# Patient Record
Sex: Female | Born: 1990 | Race: Black or African American | Hispanic: No | Marital: Single | Smoking: Former smoker
Health system: Southern US, Community
[De-identification: ages and names within clinical notes are randomized; demographics above are authoritative.]

## PROBLEM LIST (undated history)

## (undated) ENCOUNTER — Emergency Department: Payer: 59 | Source: Home / Self Care

## (undated) DIAGNOSIS — S92911A Unspecified fracture of right toe(s), initial encounter for closed fracture: Secondary | ICD-10-CM

## (undated) DIAGNOSIS — E282 Polycystic ovarian syndrome: Secondary | ICD-10-CM

## (undated) DIAGNOSIS — N390 Urinary tract infection, site not specified: Secondary | ICD-10-CM

## (undated) DIAGNOSIS — K2970 Gastritis, unspecified, without bleeding: Secondary | ICD-10-CM

## (undated) DIAGNOSIS — N83209 Unspecified ovarian cyst, unspecified side: Secondary | ICD-10-CM

## (undated) HISTORY — DX: Polycystic ovarian syndrome: E28.2

## (undated) HISTORY — PX: WISDOM TOOTH EXTRACTION: SHX21

## (undated) HISTORY — PX: COSMETIC SURGERY: SHX468

## (undated) HISTORY — PX: OTHER SURGICAL HISTORY: SHX169

---
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
## 2013-09-07 ENCOUNTER — Emergency Department: Payer: Self-pay | Admitting: Emergency Medicine

## 2013-09-07 ENCOUNTER — Emergency Department: Payer: Self-pay | Admitting: Internal Medicine

## 2013-09-07 LAB — COMPREHENSIVE METABOLIC PANEL
ALBUMIN: 4.2 g/dL (ref 3.4–5.0)
ALK PHOS: 64 U/L
ALT: 16 U/L (ref 12–78)
Anion Gap: 3 — ABNORMAL LOW (ref 7–16)
BUN: 7 mg/dL (ref 7–18)
Bilirubin,Total: 0.3 mg/dL (ref 0.2–1.0)
Calcium, Total: 8.9 mg/dL (ref 8.5–10.1)
Chloride: 110 mmol/L — ABNORMAL HIGH (ref 98–107)
Co2: 26 mmol/L (ref 21–32)
Creatinine: 0.79 mg/dL (ref 0.60–1.30)
EGFR (African American): >60
EGFR (Non-African Amer.): >60
Glucose: 96 mg/dL (ref 65–99)
Osmolality: 275 (ref 275–301)
Potassium: 3.4 mmol/L — ABNORMAL LOW (ref 3.5–5.1)
SGOT(AST): 20 U/L (ref 15–37)
SODIUM: 139 mmol/L (ref 136–145)
Total Protein: 7.8 g/dL (ref 6.4–8.2)

## 2013-09-07 LAB — CBC WITH DIFFERENTIAL/PLATELET
Basophil #: 0.1 10*3/uL (ref 0.0–0.1)
Basophil %: 0.6 %
Eosinophil #: 0.1 10*3/uL (ref 0.0–0.7)
Eosinophil %: 1.4 %
HCT: 38.8 % (ref 35.0–47.0)
HGB: 13 g/dL (ref 12.0–16.0)
LYMPHS ABS: 2.2 10*3/uL (ref 1.0–3.6)
Lymphocyte %: 22.9 %
MCH: 29.1 pg (ref 26.0–34.0)
MCHC: 33.5 g/dL (ref 32.0–36.0)
MCV: 87 fL (ref 80–100)
MONO ABS: 0.7 x10 3/mm (ref 0.2–0.9)
Monocyte %: 7.5 %
NEUTROS PCT: 67.6 %
Neutrophil #: 6.3 10*3/uL (ref 1.4–6.5)
PLATELETS: 280 10*3/uL (ref 150–440)
RBC: 4.47 10*6/uL (ref 3.80–5.20)
RDW: 13.7 % (ref 11.5–14.5)
WBC: 9.4 10*3/uL (ref 3.6–11.0)

## 2013-09-07 LAB — URINALYSIS, COMPLETE
Bilirubin,UR: NEGATIVE
Glucose,UR: NEGATIVE mg/dL (ref 0–75)
Ketone: NEGATIVE
Leukocyte Esterase: NEGATIVE
NITRITE: NEGATIVE
PH: 5 (ref 4.5–8.0)
Protein: 30
RBC,UR: 2 /HPF (ref 0–5)
Specific Gravity: 1.027 (ref 1.003–1.030)
Squamous Epithelial: 5

## 2013-09-07 LAB — PREGNANCY, URINE: PREGNANCY TEST, URINE: NEGATIVE m[IU]/mL

## 2013-09-09 ENCOUNTER — Emergency Department: Payer: Self-pay | Admitting: Emergency Medicine

## 2013-09-09 LAB — COMPREHENSIVE METABOLIC PANEL
ALBUMIN: 3.9 g/dL (ref 3.4–5.0)
ALT: 13 U/L (ref 12–78)
ANION GAP: 6 — AB (ref 7–16)
Alkaline Phosphatase: 58 U/L
BUN: 6 mg/dL — ABNORMAL LOW (ref 7–18)
Bilirubin,Total: 0.4 mg/dL (ref 0.2–1.0)
CO2: 25 mmol/L (ref 21–32)
CREATININE: 0.82 mg/dL (ref 0.60–1.30)
Calcium, Total: 8.4 mg/dL — ABNORMAL LOW (ref 8.5–10.1)
Chloride: 108 mmol/L — ABNORMAL HIGH (ref 98–107)
EGFR (African American): >60
GLUCOSE: 85 mg/dL (ref 65–99)
Osmolality: 274 (ref 275–301)
Potassium: 3.5 mmol/L (ref 3.5–5.1)
SGOT(AST): 18 U/L (ref 15–37)
Sodium: 139 mmol/L (ref 136–145)
Total Protein: 7.2 g/dL (ref 6.4–8.2)

## 2013-09-09 LAB — URINALYSIS, COMPLETE
BACTERIA: NONE SEEN
Bilirubin,UR: NEGATIVE
GLUCOSE, UR: NEGATIVE mg/dL (ref 0–75)
LEUKOCYTE ESTERASE: NEGATIVE
Nitrite: NEGATIVE
Ph: 6 (ref 4.5–8.0)
Protein: NEGATIVE
Specific Gravity: 1.011 (ref 1.003–1.030)

## 2013-09-09 LAB — CBC WITH DIFFERENTIAL/PLATELET
BASOS ABS: 0.1 10*3/uL (ref 0.0–0.1)
BASOS PCT: 0.8 %
EOS ABS: 0.1 10*3/uL (ref 0.0–0.7)
EOS PCT: 1.8 %
HCT: 38 % (ref 35.0–47.0)
HGB: 12.3 g/dL (ref 12.0–16.0)
LYMPHS ABS: 2.5 10*3/uL (ref 1.0–3.6)
Lymphocyte %: 30.2 %
MCH: 28.4 pg (ref 26.0–34.0)
MCHC: 32.3 g/dL (ref 32.0–36.0)
MCV: 88 fL (ref 80–100)
MONO ABS: 0.8 x10 3/mm (ref 0.2–0.9)
MONOS PCT: 9.7 %
NEUTROS ABS: 4.7 10*3/uL (ref 1.4–6.5)
NEUTROS PCT: 57.5 %
PLATELETS: 278 10*3/uL (ref 150–440)
RBC: 4.33 10*6/uL (ref 3.80–5.20)
RDW: 13.2 % (ref 11.5–14.5)
WBC: 8.2 10*3/uL (ref 3.6–11.0)

## 2013-09-12 ENCOUNTER — Emergency Department: Payer: Self-pay | Admitting: Emergency Medicine

## 2013-09-12 LAB — URINALYSIS, COMPLETE
BACTERIA: NONE SEEN
Bilirubin,UR: NEGATIVE
Blood: NEGATIVE
Glucose,UR: NEGATIVE mg/dL (ref 0–75)
LEUKOCYTE ESTERASE: NEGATIVE
Nitrite: NEGATIVE
Ph: 6 (ref 4.5–8.0)
Protein: NEGATIVE
SPECIFIC GRAVITY: 1.015 (ref 1.003–1.030)
Squamous Epithelial: 1

## 2013-09-12 LAB — CBC WITH DIFFERENTIAL/PLATELET
Basophil #: 0.1 10*3/uL (ref 0.0–0.1)
Basophil %: 1 %
EOS PCT: 1.6 %
Eosinophil #: 0.2 10*3/uL (ref 0.0–0.7)
HCT: 39 % (ref 35.0–47.0)
HGB: 13.6 g/dL (ref 12.0–16.0)
LYMPHS PCT: 29 %
Lymphocyte #: 2.9 10*3/uL (ref 1.0–3.6)
MCH: 29.8 pg (ref 26.0–34.0)
MCHC: 34.8 g/dL (ref 32.0–36.0)
MCV: 86 fL (ref 80–100)
Monocyte #: 0.9 x10 3/mm (ref 0.2–0.9)
Monocyte %: 9.6 %
NEUTROS ABS: 5.8 10*3/uL (ref 1.4–6.5)
Neutrophil %: 58.8 %
Platelet: 299 10*3/uL (ref 150–440)
RBC: 4.55 10*6/uL (ref 3.80–5.20)
RDW: 13.4 % (ref 11.5–14.5)
WBC: 9.8 10*3/uL (ref 3.6–11.0)

## 2013-09-12 LAB — COMPREHENSIVE METABOLIC PANEL
ALK PHOS: 60 U/L
AST: 5 U/L — AB (ref 15–37)
Albumin: 4.3 g/dL (ref 3.4–5.0)
Anion Gap: 8 (ref 7–16)
BUN: 6 mg/dL — ABNORMAL LOW (ref 7–18)
Bilirubin,Total: 0.4 mg/dL (ref 0.2–1.0)
CHLORIDE: 108 mmol/L — AB (ref 98–107)
CO2: 24 mmol/L (ref 21–32)
CREATININE: 0.8 mg/dL (ref 0.60–1.30)
Calcium, Total: 9.3 mg/dL (ref 8.5–10.1)
EGFR (Non-African Amer.): >60
Glucose: 89 mg/dL (ref 65–99)
OSMOLALITY: 276 (ref 275–301)
POTASSIUM: 3.5 mmol/L (ref 3.5–5.1)
SGPT (ALT): 9 U/L — ABNORMAL LOW (ref 12–78)
Sodium: 140 mmol/L (ref 136–145)
Total Protein: 8 g/dL (ref 6.4–8.2)

## 2013-09-12 LAB — LIPASE, BLOOD: LIPASE: 105 U/L (ref 73–393)

## 2013-09-16 ENCOUNTER — Emergency Department: Payer: Self-pay | Admitting: Emergency Medicine

## 2013-09-16 LAB — URINALYSIS, COMPLETE
BACTERIA: NONE SEEN
Bilirubin,UR: NEGATIVE
Glucose,UR: NEGATIVE mg/dL (ref 0–75)
Leukocyte Esterase: NEGATIVE
Nitrite: NEGATIVE
PH: 5 (ref 4.5–8.0)
PROTEIN: NEGATIVE
RBC,UR: 1 /HPF (ref 0–5)
Specific Gravity: 1.024 (ref 1.003–1.030)
Squamous Epithelial: <1
WBC UR: 2 /HPF (ref 0–5)

## 2013-09-16 LAB — CBC WITH DIFFERENTIAL/PLATELET
Basophil #: 0 10*3/uL (ref 0.0–0.1)
Basophil %: 0.3 %
EOS ABS: 0.1 10*3/uL (ref 0.0–0.7)
Eosinophil %: 0.7 %
HCT: 39.5 % (ref 35.0–47.0)
HGB: 12.9 g/dL (ref 12.0–16.0)
Lymphocyte #: 1.9 10*3/uL (ref 1.0–3.6)
Lymphocyte %: 16.6 %
MCH: 27.9 pg (ref 26.0–34.0)
MCHC: 32.7 g/dL (ref 32.0–36.0)
MCV: 86 fL (ref 80–100)
Monocyte #: 0.9 x10 3/mm (ref 0.2–0.9)
Monocyte %: 7.8 %
NEUTROS ABS: 8.7 10*3/uL — AB (ref 1.4–6.5)
Neutrophil %: 74.6 %
PLATELETS: 294 10*3/uL (ref 150–440)
RBC: 4.61 10*6/uL (ref 3.80–5.20)
RDW: 13.3 % (ref 11.5–14.5)
WBC: 11.6 10*3/uL — ABNORMAL HIGH (ref 3.6–11.0)

## 2013-09-16 LAB — COMPREHENSIVE METABOLIC PANEL
ALK PHOS: 63 U/L
ALT: 14 U/L (ref 12–78)
Albumin: 4.5 g/dL (ref 3.4–5.0)
Anion Gap: 9 (ref 7–16)
BILIRUBIN TOTAL: 0.4 mg/dL (ref 0.2–1.0)
BUN: 8 mg/dL (ref 7–18)
Calcium, Total: 8.8 mg/dL (ref 8.5–10.1)
Chloride: 106 mmol/L (ref 98–107)
Co2: 22 mmol/L (ref 21–32)
Creatinine: 0.87 mg/dL (ref 0.60–1.30)
EGFR (African American): >60
GLUCOSE: 80 mg/dL (ref 65–99)
OSMOLALITY: 271 (ref 275–301)
Potassium: 3.6 mmol/L (ref 3.5–5.1)
SGOT(AST): 18 U/L (ref 15–37)
SODIUM: 137 mmol/L (ref 136–145)
Total Protein: 7.9 g/dL (ref 6.4–8.2)

## 2013-09-16 LAB — LIPASE, BLOOD: Lipase: 79 U/L (ref 73–393)

## 2013-09-17 ENCOUNTER — Observation Stay: Payer: Self-pay | Admitting: Internal Medicine

## 2013-09-17 LAB — CLOSTRIDIUM DIFFICILE(ARMC)

## 2013-09-17 LAB — GC/CHLAMYDIA PROBE AMP

## 2013-09-18 LAB — BASIC METABOLIC PANEL
Anion Gap: 8 (ref 7–16)
BUN: 4 mg/dL — AB (ref 7–18)
Calcium, Total: 8.5 mg/dL (ref 8.5–10.1)
Chloride: 108 mmol/L — ABNORMAL HIGH (ref 98–107)
Co2: 21 mmol/L (ref 21–32)
Creatinine: 0.82 mg/dL (ref 0.60–1.30)
EGFR (African American): >60
Glucose: 65 mg/dL (ref 65–99)
Osmolality: 269 (ref 275–301)
POTASSIUM: 3.6 mmol/L (ref 3.5–5.1)
Sodium: 137 mmol/L (ref 136–145)

## 2013-09-18 LAB — CBC WITH DIFFERENTIAL/PLATELET
Basophil #: 0 10*3/uL (ref 0.0–0.1)
Basophil %: 0.7 %
EOS ABS: 0.2 10*3/uL (ref 0.0–0.7)
EOS PCT: 2.5 %
HCT: 35 % (ref 35.0–47.0)
HGB: 12.2 g/dL (ref 12.0–16.0)
LYMPHS ABS: 2.3 10*3/uL (ref 1.0–3.6)
LYMPHS PCT: 33.5 %
MCH: 30.2 pg (ref 26.0–34.0)
MCHC: 34.7 g/dL (ref 32.0–36.0)
MCV: 87 fL (ref 80–100)
Monocyte #: 0.7 x10 3/mm (ref 0.2–0.9)
Monocyte %: 9.8 %
NEUTROS PCT: 53.5 %
Neutrophil #: 3.7 10*3/uL (ref 1.4–6.5)
Platelet: 232 10*3/uL (ref 150–440)
RBC: 4.03 10*6/uL (ref 3.80–5.20)
RDW: 13.7 % (ref 11.5–14.5)
WBC: 6.8 10*3/uL (ref 3.6–11.0)

## 2013-09-19 LAB — STOOL CULTURE

## 2013-09-19 LAB — WBCS, STOOL

## 2013-09-21 LAB — STOOL CULTURE

## 2013-09-23 LAB — PATHOLOGY REPORT

## 2014-06-15 ENCOUNTER — Emergency Department: Payer: Self-pay | Admitting: Emergency Medicine

## 2014-06-15 LAB — URINALYSIS, COMPLETE
BACTERIA: NONE SEEN
Bilirubin,UR: NEGATIVE
Blood: NEGATIVE
Glucose,UR: NEGATIVE mg/dL (ref 0–75)
Ketone: NEGATIVE
NITRITE: NEGATIVE
PROTEIN: NEGATIVE
Ph: 8 (ref 4.5–8.0)
SPECIFIC GRAVITY: 1.018 (ref 1.003–1.030)
Squamous Epithelial: 7

## 2014-06-15 LAB — COMPREHENSIVE METABOLIC PANEL
ALBUMIN: 4.1 g/dL (ref 3.4–5.0)
ALK PHOS: 48 U/L
Anion Gap: 7 (ref 7–16)
BILIRUBIN TOTAL: 0.3 mg/dL (ref 0.2–1.0)
BUN: 10 mg/dL (ref 7–18)
CHLORIDE: 108 mmol/L — AB (ref 98–107)
Calcium, Total: 8.6 mg/dL (ref 8.5–10.1)
Co2: 25 mmol/L (ref 21–32)
Creatinine: 0.92 mg/dL (ref 0.60–1.30)
EGFR (African American): >60
EGFR (Non-African Amer.): >60
Glucose: 103 mg/dL — ABNORMAL HIGH (ref 65–99)
OSMOLALITY: 279 (ref 275–301)
POTASSIUM: 3.6 mmol/L (ref 3.5–5.1)
SGOT(AST): 17 U/L (ref 15–37)
SGPT (ALT): 16 U/L
Sodium: 140 mmol/L (ref 136–145)
Total Protein: 7.5 g/dL (ref 6.4–8.2)

## 2014-06-15 LAB — CBC WITH DIFFERENTIAL/PLATELET
BASOS ABS: 0.3 10*3/uL — AB (ref 0.0–0.1)
Basophil %: 3.4 %
EOS PCT: 0.6 %
Eosinophil #: 0.1 10*3/uL (ref 0.0–0.7)
HCT: 39 % (ref 35.0–47.0)
HGB: 13 g/dL (ref 12.0–16.0)
LYMPHS ABS: 0.9 10*3/uL — AB (ref 1.0–3.6)
Lymphocyte %: 10.9 %
MCH: 29.9 pg (ref 26.0–34.0)
MCHC: 33.3 g/dL (ref 32.0–36.0)
MCV: 90 fL (ref 80–100)
Monocyte #: 0.3 x10 3/mm (ref 0.2–0.9)
Monocyte %: 3.5 %
NEUTROS PCT: 81.6 %
Neutrophil #: 6.7 10*3/uL — ABNORMAL HIGH (ref 1.4–6.5)
Platelet: 248 10*3/uL (ref 150–440)
RBC: 4.35 10*6/uL (ref 3.80–5.20)
RDW: 13.7 % (ref 11.5–14.5)
WBC: 8.2 10*3/uL (ref 3.6–11.0)

## 2014-06-15 LAB — LIPASE, BLOOD: Lipase: 78 U/L (ref 73–393)

## 2014-07-03 ENCOUNTER — Emergency Department: Payer: Self-pay | Admitting: Emergency Medicine

## 2014-10-24 NOTE — H&P (Signed)
PATIENT NAME:  Rose Campos, Rose Campos MR#:  [id number] DATE OF BIRTH:  Oct 25, 1990  DATE OF ADMISSION:  09/16/2013  REFERRING PHYSICIAN: Valli Glance. Owens Shark, MD  PRIMARY CARE PHYSICIAN: The patient started to follow at [facility] recently.   CHIEF COMPLAINT: Nausea, vomiting, diarrhea.   HISTORY OF PRESENT ILLNESS: This is a 24 year old female without significant past medical history, who presents with complaints of nausea, vomiting and diarrhea. The patient reports her symptoms have been going on for the last 10 days. Reports it did get better after 4 days, but then it started to worsen over the last 2 days. Reports she had vomited x2 yesterday, with 3 episodes of watery diarrhea. Denies any fever, any chills, any abdominal pain, any lightheadedness, any dizziness. The patient was in ED yesterday for similar complaints, where she was requested to follow with Dr. Vira Agar. She went to see Dr. Vira Agar at the office and reports she had some blood work done. She reports she had some stool workup done for blood which she reported was negative. Reports she was requested by Dr. Vira Agar to come to the ED. ED discussed with Dr. Vira Agar, who requested the patient to be admitted for further workup. The patient had blood work done yesterday which did not show any renal failure. Since the patient has been in ED, she did not have any nausea, any diarrhea, so we could not collect any stool workup from her.   PAST MEDICAL HISTORY: None.   PAST SURGICAL HISTORY: None.   ALLERGIES: NICKEL.   HOME MEDICATIONS:  1. Citalopram 20 mg oral daily.  2. Zofran as needed.   SOCIAL HISTORY: The patient is a Electronics engineer. Denies any smoking, any alcohol, any illicit drugs.   FAMILY HISTORY: Denies any family history of inflammatory bowel disease. No Crohn's disease. No ulcerative colitis.   REVIEW OF SYSTEMS:  CONSTITUTIONAL: Denies fever, chills, fatigue, weakness.  EYES: Denies blurry vision, double  vision, inflammation, glaucoma. ENT: Denies tinnitus, ear pain, hearing loss, epistaxis or discharge.  RESPIRATORY: Denies cough, wheezing, hemoptysis, palpitations.  CARDIOVASCULAR: Denies chest pain, edema, arrhythmia.  GASTROINTESTINAL: Reports nausea, vomiting and diarrhea. Denies any abdominal pain, any constipation, any hematemesis, any melena, any rectal bleed, any jaundice.  GENITOURINARY: Denies dysuria, hematuria or renal colic.  ENDOCRINE: Denies polyuria, polydipsia, heat or cold intolerance. HEMATOLOGY: Denies anemia, easy bruising, bleeding diathesis.  INTEGUMENTARY: Denies any acne, rash or skin lesion.  MUSCULOSKELETAL: Denies any gout, swelling, cramps, arthritis. NEUROLOGIC: Denies any migraine, tremors, vertigo, ataxia, headache.  PSYCHIATRIC: Denies any anxiety, depression or insomnia.    PHYSICAL EXAMINATION:  VITAL SIGNS: Temperature 98.1, pulse 89, respiratory rate 16, blood pressure 136/86, saturating 98% on room air.  GENERAL: Physical exam was done in the presence of the patient's female chaperone, her registered nurse, Estill Bamberg. A well-nourished female who looks comfortable, in no apparent distress.  HEENT: Head atraumatic, normocephalic. Pupils equally reactive to light. Pink conjunctivae. Anicteric sclerae. Moist oral mucosa.  NECK: Supple. No thyromegaly. No JVD.  CHEST: Good air entry bilaterally. No wheezing, rales, rhonchi.  CARDIOVASCULAR: S1, S2 heard. No rubs, murmurs or gallops.  ABDOMEN: Mild tenderness in the left upper quadrant. Bowel sounds present. No rebound, no guarding. No hepatosplenomegaly.  EXTREMITIES: No edema. No clubbing. No cyanosis. Pedal pulses +2 bilaterally.  PSYCHIATRIC: Appropriate affect. Awake, alert x3. Intact judgment and insight.  NEUROLOGIC: Cranial nerves grossly intact. Motor 5 out of 5. No focal deficits.  SKIN: Normal skin turgor. Warm and dry.  MUSCULOSKELETAL: No  joint effusion or erythema.   PERTINENT LABORATORIES: As of  March 17th, glucose 80, BUN 8, creatinine 0.87, sodium 137, potassium 3.6, chloride 106, CO2 22, ALT 14, AST 18, alkaline phosphatase 63, total bilirubin 0.4. White blood cells 11.6, hemoglobin 12.9, hematocrit 39.5, platelets 294. Urinalysis negative for leukocyte esterase and nitrite. Point-of-care urine pregnancy test negative.   ASSESSMENT AND PLAN:  1. Nausea, vomiting, diarrhea. This is most likely related to viral gastroenteritis. The patient will be admitted for further workup. She will be kept n.p.o., if she is decided to have any further workup by Dr. Vira Agar in the morning. Will obtain CT abdomen and pelvis with IV and p.o. contrast. Will check stool workup, including ova and parasites, stool cultures, white blood cells and C. difficile. Continue on hydration, p.r.n. nausea medicine as well.  2. Deep vein thrombosis prophylaxis. Subcutaneous heparin.   CODE STATUS: Full code.   TOTAL TIME SPENT ON ADMISSION AND PATIENT CARE: 40 minutes.   ____________________________ Albertine Patricia, MD dse:lb D: 09/17/2013 05:02:39 ET T: 09/17/2013 05:53:37 ET JOB#: [id number]  cc: Albertine Patricia, MD, <Dictator> Carlen Rebuck Graciela Husbands MD ELECTRONICALLY SIGNED 09/22/2013 1:51

## 2014-10-24 NOTE — Discharge Summary (Signed)
PATIENT NAME:  Rose Campos, SCULLIN MR#:  [id number] DATE OF BIRTH:  1990/10/09  DATE OF ADMISSION:  09/17/2013  DATE OF DISCHARGE:  09/19/2013  DISCHARGE DIAGNOSES: 1.  Gastritis.  2.  Dehydration.  3.  Possible celiac disease.   CONSULTS: Dr. Vira Agar with GI, and Dr. Marcelline Mates with Gynecology.   IMAGING STUDIES INCLUDE:  A CT scan of the abdomen and pelvis, which showed some pelvic fluid with ovarian cyst.   Pelvic ultrasound showed a luteal cyst with pelvic ascites, likely physiologic.   Ultrasound abdomen, right upper quadrant was within normal limits.   ADMITTING HISTORY AND PHYSICAL: Please see detailed H and P dictated previously by Dr. Waldron Labs. In brief, a 24 year old African-American female patient, presented to the hospital from Dr. Percell Boston office for admission after she had some nausea, vomiting, abdominal pain for the past few days, with recurrent visits to the ER.   The patient was admitted, found to be dehydrated, started on IV fluids. Was seen by Dr. Vira Agar, had an EGD done, which showed gastritis and possible celiac disease. She was also seen by Gynecology, and there was a little fluid in the pelvis on CT scan of the abdomen and pelvis. A transvaginal ultrasound was done, which showed physiological findings with a luteal cyst, possible simple ovarian cyst.   Prior to discharge, the patient has tolerated food. Had some mild nausea, but no vomiting. Has ambulated well. Abdomen is nontender. Lungs sound clear, and is being discharged home.   DISCHARGE MEDICATIONS: 1.  Citalopram 20 mg oral once a day.  2.  Zofran ODT 4 mg oral every 4 hours as needed for nausea, vomiting.  3.  Sucralfate 1 gram oral 4 times a day before meals and at bedtime.  4.  Protonix 40 mg oral 2 times a day.   DISCHARGE INSTRUCTIONS: Regular diet. Activity as tolerated. Follow up with primary care physician in 1 to 2 weeks, and with Dr. Vira Agar in 1 to 2 weeks.  Time spent on day of discharge in  discharge activity was 35 minutes.    ____________________________ Rose Alf Jakyra Kenealy, MD srs:mr D: 09/19/2013 15:49:00 ET T: 09/19/2013 22:42:50 ET JOB#: [id number]  cc: Alveta Heimlich R. Markus Casten, MD, <Dictator> Neita Carp MD ELECTRONICALLY SIGNED 09/27/2013 10:27

## 2014-10-24 NOTE — Consult Note (Signed)
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 Her mother is a CNA and her aunt is a Marine scientist. She has never been in a hospital before.   She has had multiple visits to the ER and to different providers in the last two weeks. She says she has been sick since March 6. She has stomach pain around her navel. She has nausea, vomiting, and diarrhea, vomiting 2 or 3 times a day, diarrhea 2 or 3 times a day. She does say the vomiting helps the nausea. She is not running any fever.   Monday, two days ago, she awoke with generalized abdominal pain and pain increases when she moves around. She was seen in the ER, referred to our office. We evaluated her and started a treatment plan, and she went back to the ER within 24 hours, and the decision was made because of her multiple visits that it would be best to admit her to the hospital for evaluation and treatment, especially with IV fluids, and the urine showed 2+ ketones. This was significant evidence that she has not been eating well.   HABITS: The patient denies alcohol. She smokes maybe one cigarette a day.    She has seen her primary doctor and was diagnosed with anxiety and was started on citalopram but had taken maybe one pill only. She had taken Imodium, took two yesterday and has not had a bowel movement since then. Before that, she was having at least two loose stools a day, mostly in the morning. Her last spell of vomiting was at 8:00 yesterday.   PAST MEDICAL AND SURGICAL HISTORY: Negative. She has never been an inpatient before.   ALLERGIES: NICKEL.  HOME MEDICATIONS: Citalopram 20 mg daily.   FAMILY HISTORY: No Crohn disease. No ulcerative colitis.   REVIEW OF SYSTEMS:   CONSTITUTIONAL: No fever or chills. No coughing, wheezing. No chest pains or skipping irregular heartbeats.  GENITOURINARY: No dysuria. No hematuria. No kidney stones. Denies any syncopal spells.   PHYSICAL EXAMINATION:  GENERAL: Black female in no acute distress  VITAL SIGNS: Temperature is 98.9. Pulse 73, respirations 18, blood pressure 119/77, pulse ox 100%.  HEENT: Sclerae anicteric. Conjunctivae negative. Tongue negative.  HEAD: Atraumatic.  CHEST: Clear.  HEART: No murmurs or gallops I can hear.  ABDOMEN: Minimal epigastric tenderness. Minimal tenderness of periumbilical area. No hepatosplenomegaly. No masses. No bruits. No peritoneal signs.  SKIN: Warm and dry.  PSYCHIATRIC: Mood and affect are appropriate.   LABORATORY DATA: Glucose 80, BUN 8, creatinine 0.87, sodium 137, potassium 3.6, chloride 106, CO2 22, ALT 14, AST 18, alk phos 63, total bilirubin 0.4. White cells 11.6, hemoglobin 12.9, platelet count 294. Her urine did show 2+ ketones, 1+ blood.   Pregnancy test is negative.   After admission, the patient has been placed on IV fluids and she has had labs drawn.   Urine pregnancy test is negative.   Ultrasound of the abdomen showed no gallstones or gallbladder thickening. It was limited to the right upper quadrant.   She had a CAT scan today with contrast, oral and IV. The stomach contained a moderate amount of fluid but minimal contrast. There was diffuse sigmoid diverticula without evidence of diverticulitis. There  was no evidence for appendicitis. There was a poor definition of adnexal structures bilaterally. This may reflect ovarian pathology, possible infection or endometriosis. There is likely fluid within the retroverted uterus. There is free fluid within the pelvis.   Further evaluation with pelvic ultrasound would be useful. Gynecological evaluation may be helpful as well.   ASSESSMENT: Prolonged periods of symptoms of nausea, vomiting and diarrhea, mid abdominal  discomfort, with a significant amount of fluid in the pelvis, and ovarian irregularities.   She could be popping an ovarian cyst. She could possibly have pelvic inflammatory disease, early onset. I spoke to Dr. Darvin Neighbours, the hospitalist, and recommended a pelvic ultrasound and probable GYN consultation.   At this time, we will plan for upper endoscopy for tomorrow.   ____________________________ Manya Silvas, MD rte:np D: 09/17/2013 16:37:23 ET T: 09/17/2013 17:00:51 ET JOB#: [id number]  cc: Manya Silvas, MD, <Dictator> Valli Glance. Owens Shark, MD Manya Silvas MD ELECTRONICALLY SIGNED 09/26/2013 18:21

## 2014-10-24 NOTE — Consult Note (Signed)
Patient with localized signif gastritis from vomiting.  Should have bid PPI and carafate before each meal.  Would try full liquids but she should keep these down and her meds before any discharge home.  Also should go home on oral dissolving Zofran tablet prn for nausea or vomiting.  Can follow up in office 2 weeks after discharge.  Electronic Signatures: Manya Silvas (MD)  (Signed on 19-Mar-15 17:32)  Authored  Last Updated: 19-Mar-15 17:32 by Manya Silvas (MD)

## 2014-10-24 NOTE — Consult Note (Signed)
Plan EGD for tomorrow at 1:30pm. Indication for abd pain and vomiting.  Electronic Signatures: Manya Silvas (MD)  (Signed on 18-Mar-15 17:51)  Authored  Last Updated: 18-Mar-15 17:51 by Manya Silvas (MD)

## 2014-12-17 IMAGING — CT CT ABD-PELV W/ CM
2 of 4 series · 15 of 46 positions shown, 17 images · IV contrast (isovue)
Comparison: US ABDOMEN LIMITED RUQ/ASCITES dated 09/16/2013

CLINICAL DATA: Chronic persistent abdominal pain with nausea and
vomiting and diarrhea

EXAM:
CT ABDOMEN AND PELVIS WITH CONTRAST
TECHNIQUE: Multidetector CT imaging of the abdomen and pelvis was performed
using the standard protocol following bolus administration of
intravenous contrast.
CONTRAST:  100 cc of Isovue 370 intravenously. The patient also
received oral contrast material.

[Series 2: routine abd pel with · axial · 0.72mm/px · z∈[-914,-564]mm · 12 of 84 slices shown, 14 images]
[im 7/84  soft-tissue]
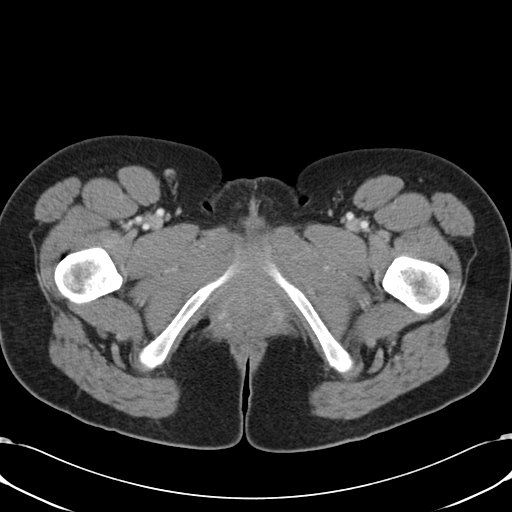
[im 7/84  bone]
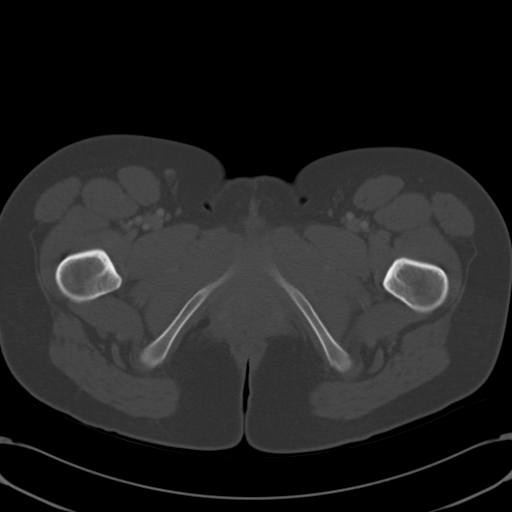
[im 13/84  soft-tissue]
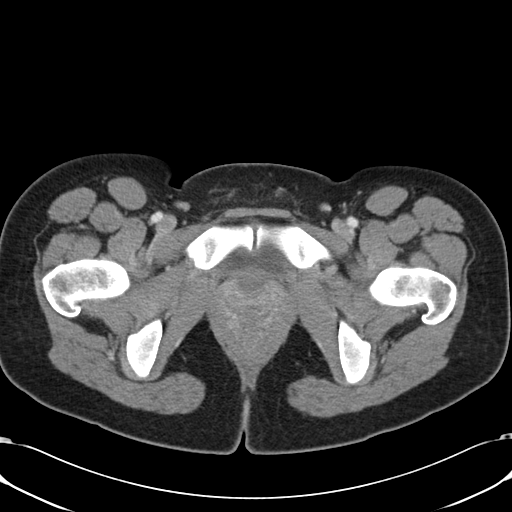
[im 19/84  soft-tissue]
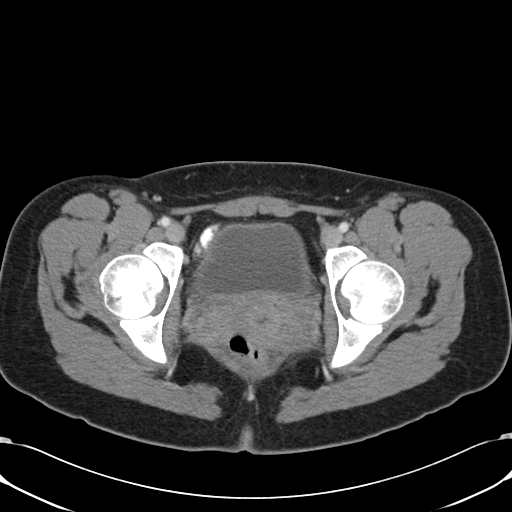
[im 25/84  soft-tissue]
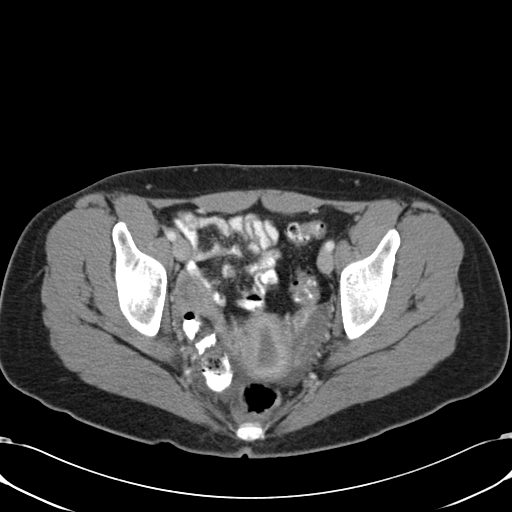
[im 31/84  soft-tissue]
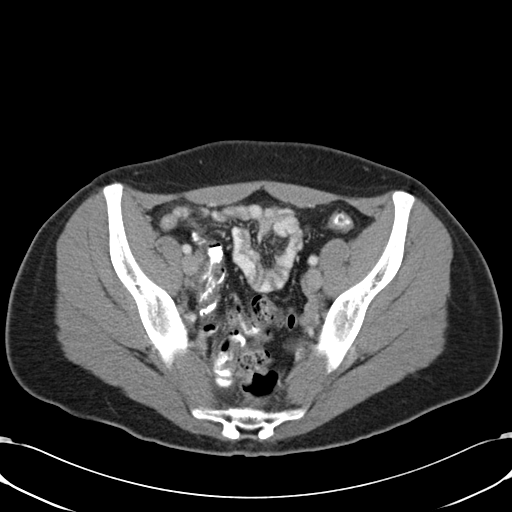
[im 37/84  soft-tissue]
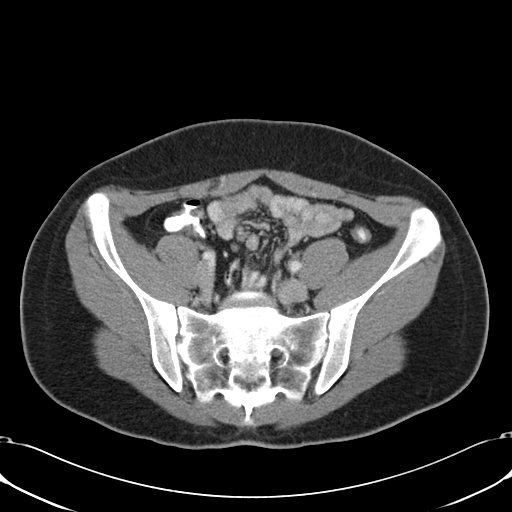
[im 47/84  soft-tissue]
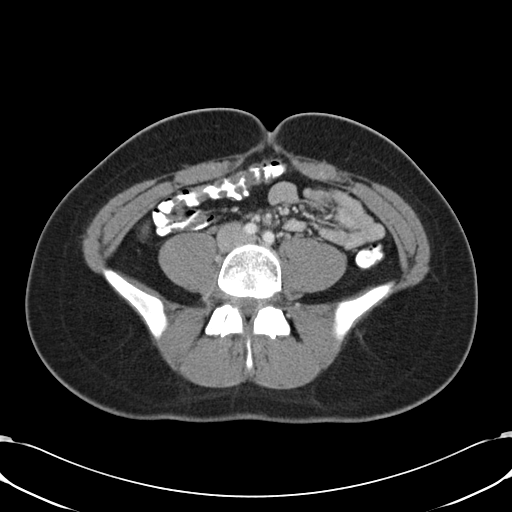
[im 53/84  soft-tissue]
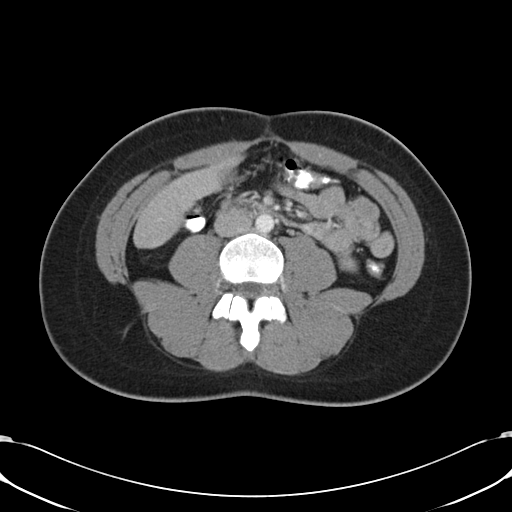
[im 59/84  soft-tissue]
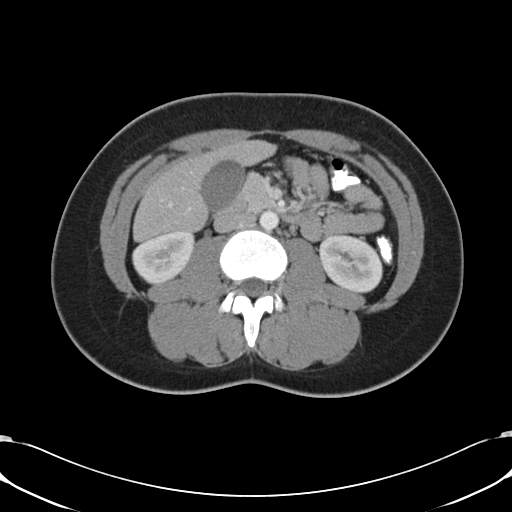
[im 59/84  bone]
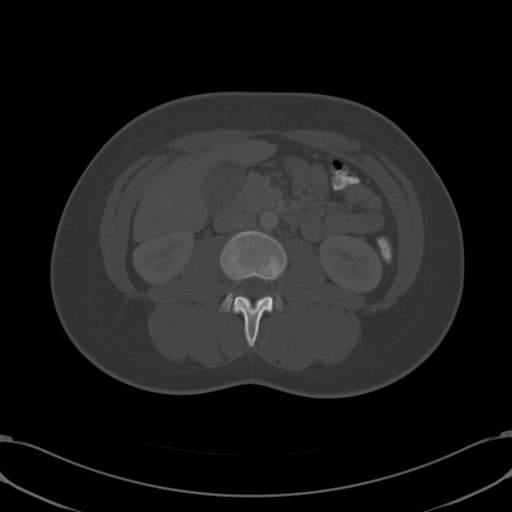
[im 65/84  soft-tissue]
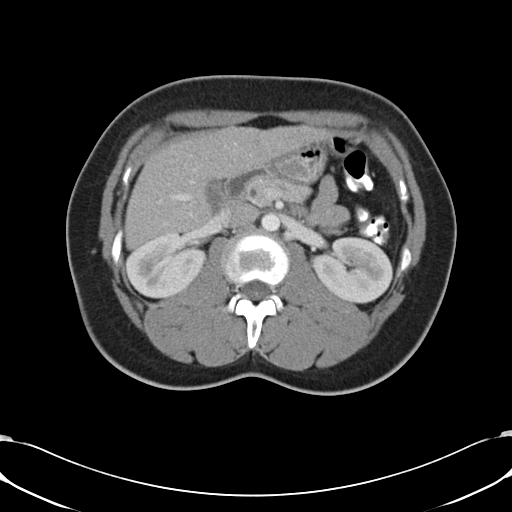
[im 71/84  soft-tissue]
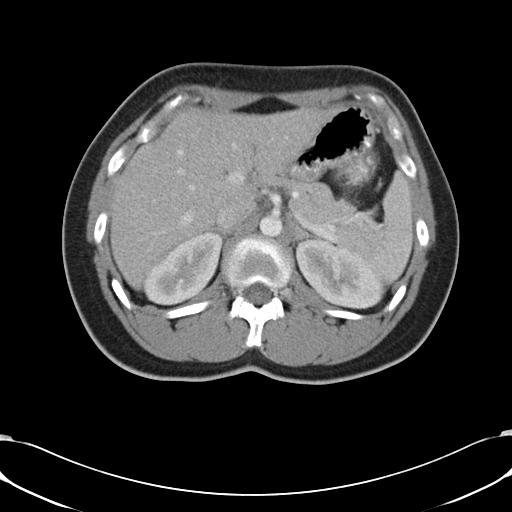
[im 77/84  soft-tissue]
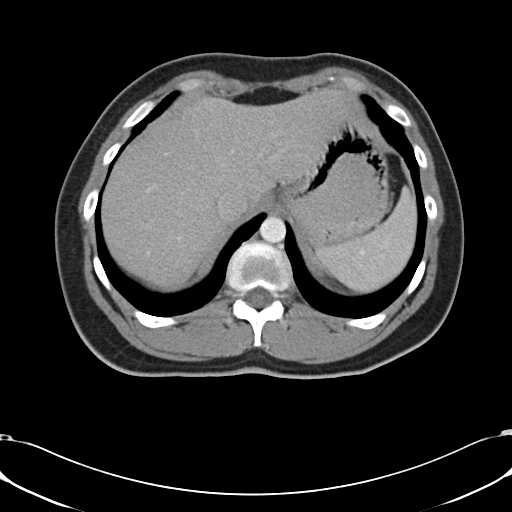

[Series 5: cor routine abd pel with · coronal · 0.66mm/px · 3 of 141 slices shown]
[im 47/141  soft-tissue]
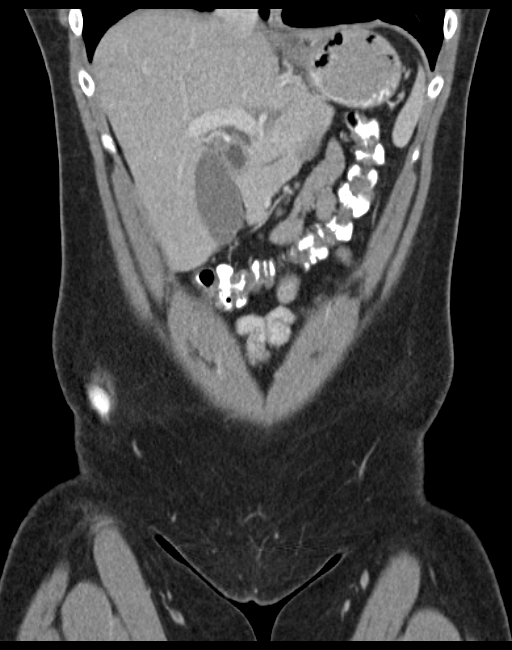
[im 63/141  soft-tissue]
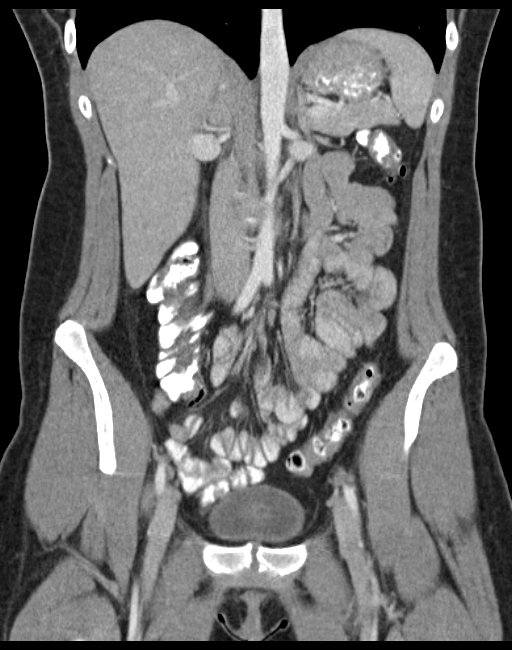
[im 78/141  soft-tissue]
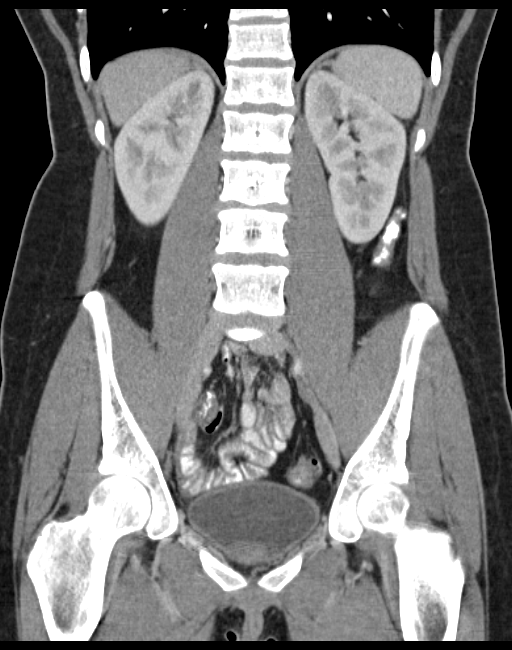

[15 of 46 positions shown; findings below may reference images not displayed]

FINDINGS: The liver exhibits no focal mass or ductal dilation. The gallbladder
is adequately distended with no evidence of stones or inflammatory
changes. The pancreas, spleen, adrenal glands, and kidneys are
normal in appearance. The caliber of the abdominal aorta is normal.
There is no periaortic or pericaval lymphadenopathy.

The stomach contains a moderate amount of fluid but minimal
contrast. The duodenum contains no contrast. There is contrast
within portions of the distal small bowel. Contrast has reached the
colon and rectum. The cecum is deeply positioned within the right
aspect of the pelvis and is poorly distended. There is a structure
which likely reflects a normal calibered, uninflamed, partially
contrast and gas filled appendix on images 45 through 52 just to the
right of midline deep within the pelvis lying to the right of the
sigmoid colon. There are a few sigmoid diverticula. There is no
evidence of acute diverticulitis.

There is free fluid in the pelvis. The uterus is retroverted. There
may be some fluid in the endometrial cavity. The adnexal structures
are poorly defined. The urinary bladder is moderately distended and
grossly normal.

The psoas musculature is normal in appearance. The lumbar vertebral
bodies are preserved in height. The bony pelvis exhibits no acute
abnormalities.
IMPRESSION: 1. There is no evidence of bowel obstruction or ileus. There is no
evidence of enteritis for acute appendicitis. Evaluation of the
cecum and proximal ascending colon is limited due to poor distention
with contrast and its positioning deep within the right aspect of
the pelvis.
2. There is poor definition of the adnexal structures bilaterally.
This may reflect ovarian pathology including infection or
endometriosis. Such entities could also be impacting the cecum.
There is likely fluid within the retroverted uterus. There is free
fluid within the pelvis. Further evaluation with pelvic ultrasound
would be useful. Gynecological evaluation may be of value as well.
3. There is no evidence of acute urinary tract or hepatobiliary
abnormality.
4. There is no intra-abdominal or pelvic lymphadenopathy.

## 2014-12-18 IMAGING — US US PELV - US TRANSVAGINAL
1 series · 14 of 25 positions shown · non-contrast
Comparison: CT abdomen pelvis dated 09/17/2013

CLINICAL DATA: Pelvic pain, free fluid in pelvis on CT

EXAM:
TRANSABDOMINAL AND TRANSVAGINAL ULTRASOUND OF PELVIS
TECHNIQUE: Both transabdominal and transvaginal ultrasound examinations of the
pelvis were performed. Transabdominal technique was performed for
global imaging of the pelvis including uterus, ovaries, adnexal
regions, and pelvic cul-de-sac. It was necessary to proceed with
endovaginal exam following the transabdominal exam to visualize the
endometrium and bilateral ovaries.

[Series 1: us pelv - us transvaginal · 0.24mm/px · 99 acquisitions, 14 frames shown]
[im 1/99]
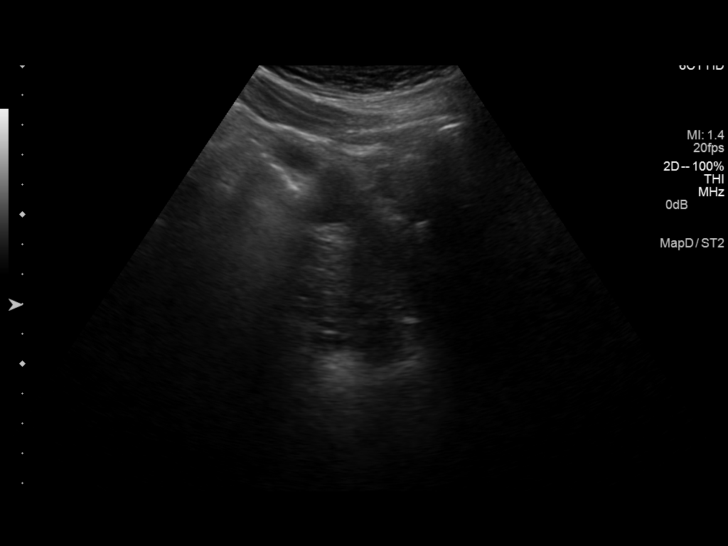
[im 9/99]
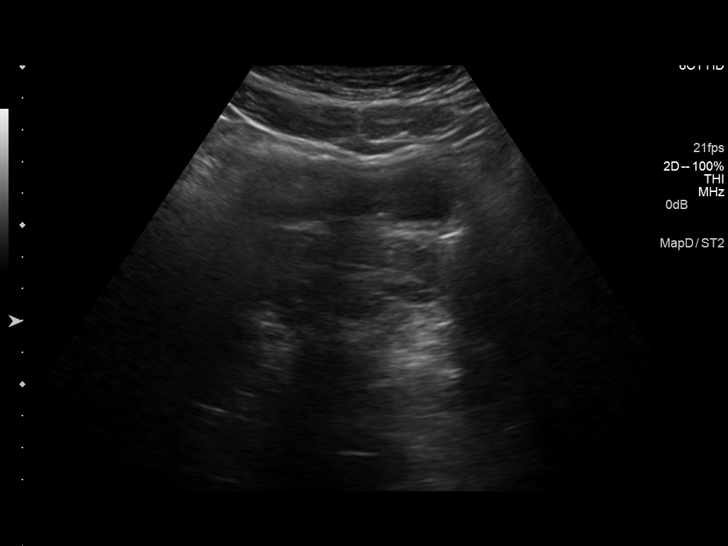
[im 17/99]
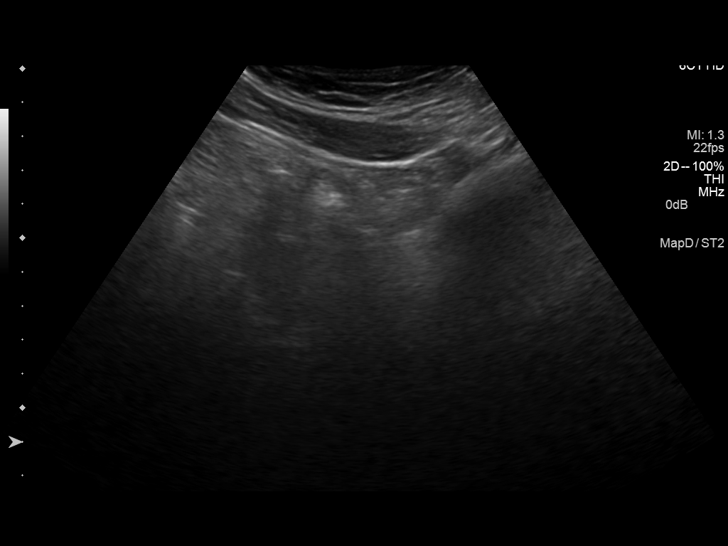
[im 25/99]
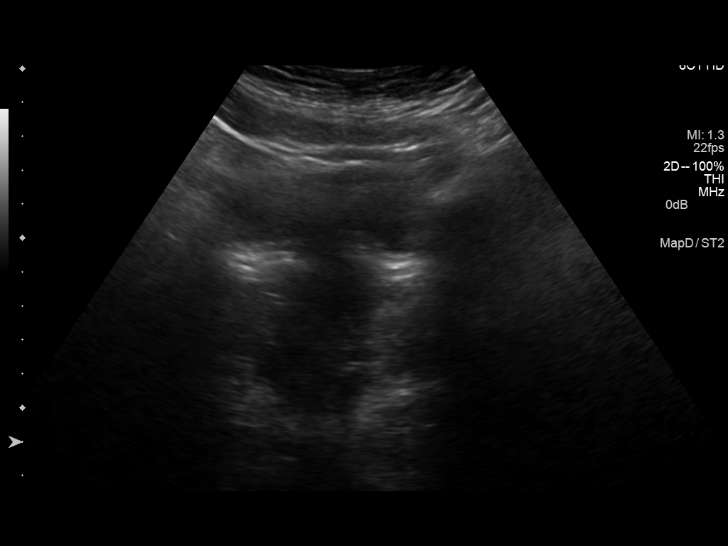
[im 33/99]
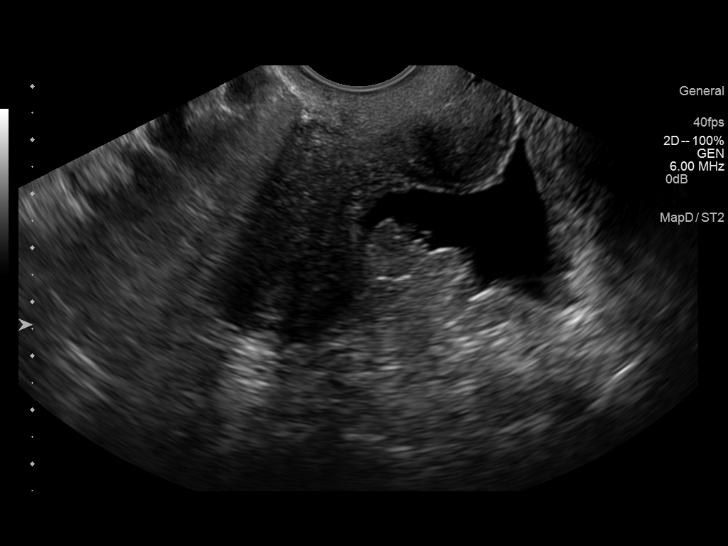
[im 37/99]
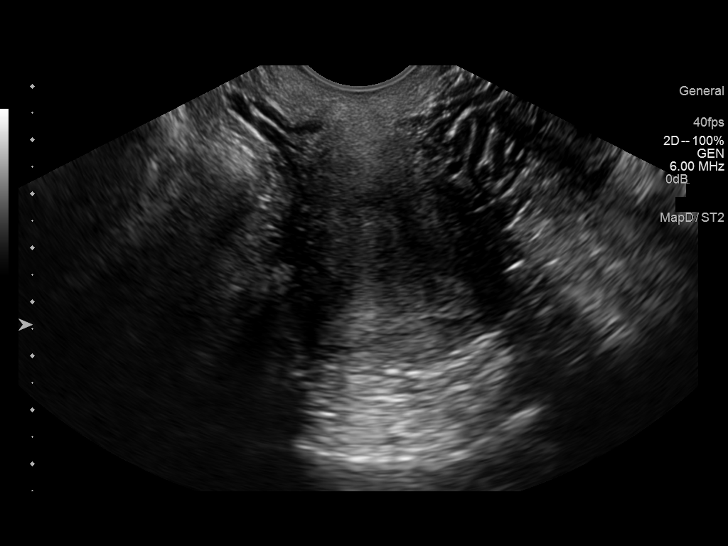
[im 45/99]
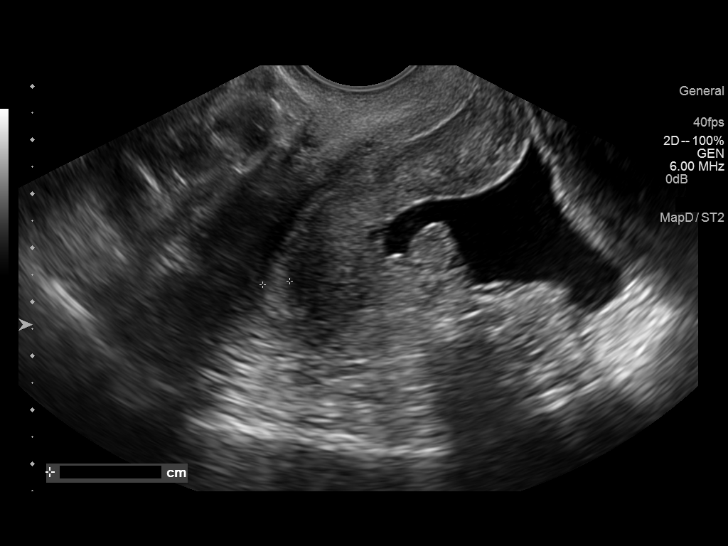
[im 54/99]
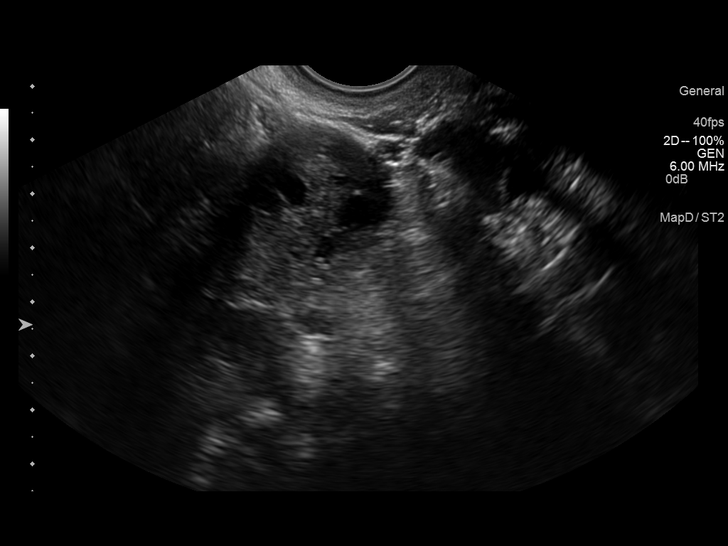
[im 62/99]
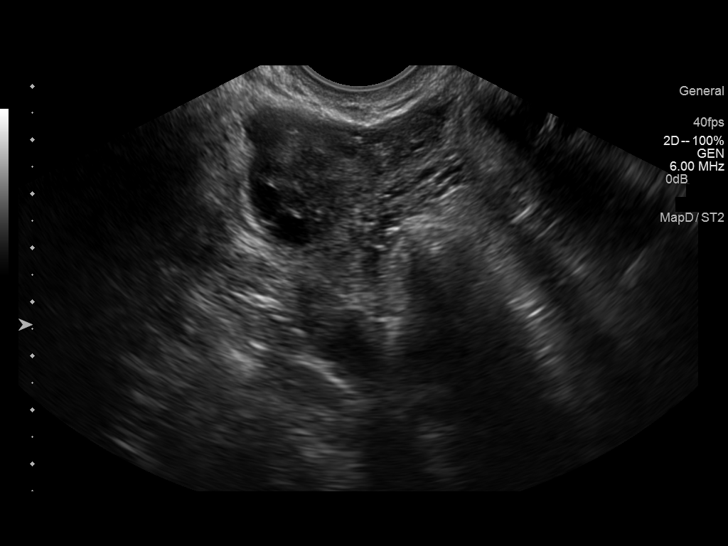
[im 66/99]
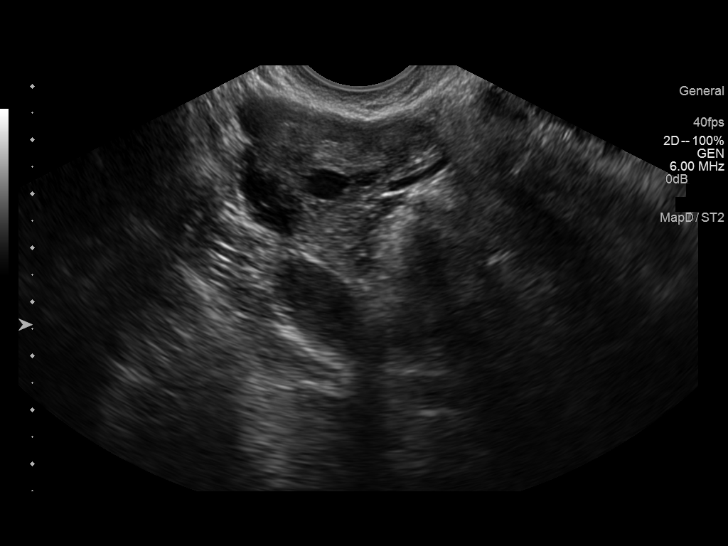
[im 74/99]
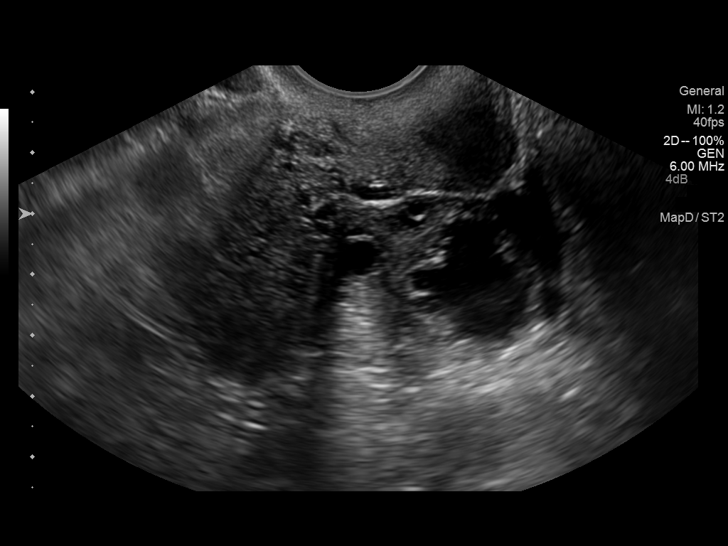
[im 82/99]
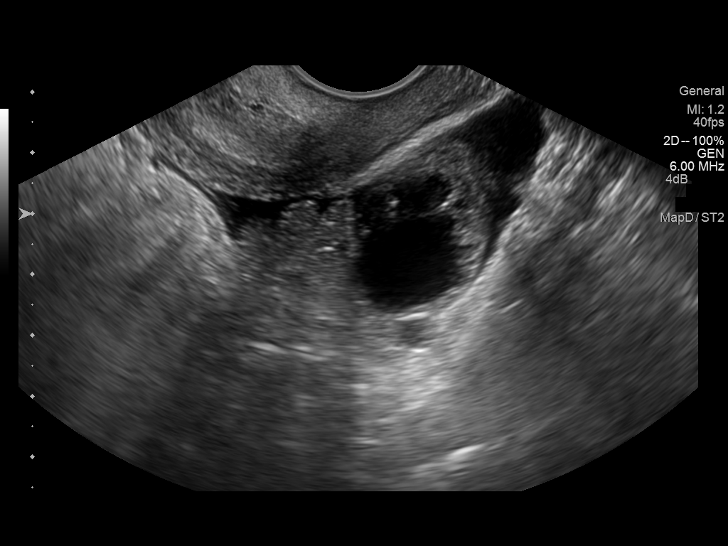
[im 90/99]
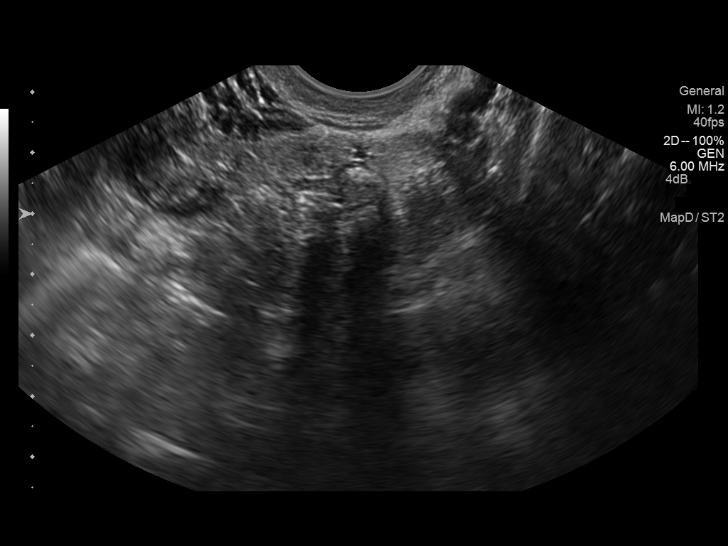
[im 99/99]
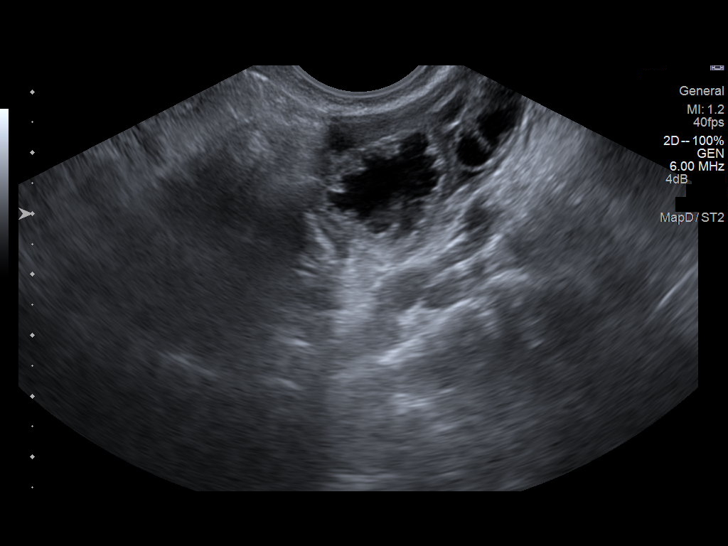

[14 of 25 positions shown; findings below may reference images not displayed]

FINDINGS: Uterus

Measurements: 6.6 x 3.1 x 4.0 cm. No fibroids or other mass
visualized.

Endometrium

Thickness: 5 mm.  No focal abnormality visualized.

Right ovary

Measurements: 4.0 x 2.6 x 3.0 cm. Normal appearance/no adnexal mass.

Left ovary

Measurements: 5.8 x 2.6 x 2.9 cm. 2.2 x 1.5 x 2.2 cm involuting
corpus luteal cyst.

Other findings

Moderate pelvic ascites, likely physiologic.
IMPRESSION: 2.2 cm involuting left corpus luteal cyst.

Moderate pelvic ascites, likely physiologic.

## 2015-03-04 ENCOUNTER — Emergency Department
Admission: EM | Admit: 2015-03-04 | Discharge: 2015-03-04 | Disposition: A | Discharge status: Home / Self Care | Payer: 59 | Attending: Emergency Medicine | Admitting: Emergency Medicine

## 2015-03-04 DIAGNOSIS — R197 Diarrhea, unspecified: Secondary | ICD-10-CM | POA: Insufficient documentation

## 2015-03-04 DIAGNOSIS — R1084 Generalized abdominal pain: Secondary | ICD-10-CM | POA: Diagnosis not present

## 2015-03-04 DIAGNOSIS — Z3202 Encounter for pregnancy test, result negative: Secondary | ICD-10-CM | POA: Diagnosis not present

## 2015-03-04 DIAGNOSIS — R1110 Vomiting, unspecified: Secondary | ICD-10-CM | POA: Diagnosis present

## 2015-03-04 LAB — COMPREHENSIVE METABOLIC PANEL
ALBUMIN: 4.6 g/dL (ref 3.5–5.0)
ALT: 13 U/L — ABNORMAL LOW (ref 14–54)
AST: 25 U/L (ref 15–41)
Alkaline Phosphatase: 41 U/L (ref 38–126)
Anion gap: 8 (ref 5–15)
BUN: 8 mg/dL (ref 6–20)
CHLORIDE: 108 mmol/L (ref 101–111)
CO2: 24 mmol/L (ref 22–32)
Calcium: 9 mg/dL (ref 8.9–10.3)
Creatinine, Ser: 0.86 mg/dL (ref 0.44–1.00)
GFR calc Af Amer: >60 mL/min (ref >60)
GFR calc non Af Amer: >60 mL/min (ref >60)
GLUCOSE: 91 mg/dL (ref 65–99)
POTASSIUM: 3.6 mmol/L (ref 3.5–5.1)
Sodium: 140 mmol/L (ref 135–145)
Total Bilirubin: 0.4 mg/dL (ref 0.3–1.2)
Total Protein: 7.4 g/dL (ref 6.5–8.1)

## 2015-03-04 LAB — CBC WITH DIFFERENTIAL/PLATELET
BASOS PCT: 1 %
Basophils Absolute: 0.1 10*3/uL (ref 0–0.1)
EOS PCT: 2 %
Eosinophils Absolute: 0.1 10*3/uL (ref 0–0.7)
HCT: 41 % (ref 35.0–47.0)
Hemoglobin: 13.8 g/dL (ref 12.0–16.0)
LYMPHS ABS: 2.3 10*3/uL (ref 1.0–3.6)
Lymphocytes Relative: 35 %
MCH: 30 pg (ref 26.0–34.0)
MCHC: 33.7 g/dL (ref 32.0–36.0)
MCV: 89 fL (ref 80.0–100.0)
MONOS PCT: 6 %
Monocytes Absolute: 0.4 10*3/uL (ref 0.2–0.9)
Neutro Abs: 3.7 10*3/uL (ref 1.4–6.5)
Neutrophils Relative %: 56 %
PLATELETS: 240 10*3/uL (ref 150–440)
RBC: 4.6 MIL/uL (ref 3.80–5.20)
RDW: 14 % (ref 11.5–14.5)
WBC: 6.7 10*3/uL (ref 3.6–11.0)

## 2015-03-04 LAB — URINALYSIS COMPLETE WITH MICROSCOPIC (ARMC ONLY)
BILIRUBIN URINE: NEGATIVE
Bacteria, UA: NONE SEEN
GLUCOSE, UA: NEGATIVE mg/dL
Hgb urine dipstick: NEGATIVE
Ketones, ur: NEGATIVE mg/dL
Leukocytes, UA: NEGATIVE
NITRITE: NEGATIVE
PH: 9 — AB (ref 5.0–8.0)
Protein, ur: 100 mg/dL — AB
Specific Gravity, Urine: 1.023 (ref 1.005–1.030)

## 2015-03-04 LAB — LIPASE, BLOOD: LIPASE: 16 U/L — AB (ref 22–51)

## 2015-03-04 MED ORDER — ONDANSETRON HCL 4 MG PO TABS: 4.0000 mg | ORAL_TABLET | Freq: Three times a day (TID) | ORAL | Status: DC | PRN

## 2015-03-04 MED ORDER — MORPHINE SULFATE (PF) 4 MG/ML IV SOLN
INTRAVENOUS | Status: AC
  Administered 2015-03-04: 4 mg via INTRAVENOUS
  Filled 2015-03-04: qty 1

## 2015-03-04 MED ORDER — ONDANSETRON HCL 4 MG/2ML IJ SOLN
INTRAMUSCULAR | Status: AC
  Administered 2015-03-04: 4 mg via INTRAVENOUS
  Filled 2015-03-04: qty 2

## 2015-03-04 MED ORDER — GI COCKTAIL ~~LOC~~
30.0000 mL | Freq: Once | ORAL | Status: AC
  Administered 2015-03-04: 30 mL via ORAL
  Filled 2015-03-04: qty 30

## 2015-03-04 MED ORDER — ONDANSETRON HCL 4 MG/2ML IJ SOLN
4.0000 mg | Freq: Once | INTRAMUSCULAR | Status: AC
  Administered 2015-03-04: 4 mg via INTRAVENOUS
  Filled 2015-03-04: qty 2

## 2015-03-04 MED ORDER — SODIUM CHLORIDE 0.9 % IV BOLUS (SEPSIS)
1000.0000 mL | Freq: Once | INTRAVENOUS | Status: AC
  Administered 2015-03-04: 1000 mL via INTRAVENOUS

## 2015-03-04 MED ORDER — MORPHINE SULFATE (PF) 4 MG/ML IV SOLN
4.0000 mg | Freq: Once | INTRAVENOUS | Status: AC
  Administered 2015-03-04: 4 mg via INTRAVENOUS

## 2015-03-04 MED ORDER — ONDANSETRON HCL 4 MG/2ML IJ SOLN
4.0000 mg | Freq: Once | INTRAMUSCULAR | Status: AC
  Administered 2015-03-04: 4 mg via INTRAVENOUS

## 2015-03-04 NOTE — Discharge Instructions (Signed)
You were treated for vomiting and diarrhea which I suspect is due to a virus. Your exam and evaluation are reassuring. You're given symptomatic pain and nausea medications as well as IV fluids here in the emergency department.  Return to the emergency room for any worsening condition including fever, black or bloody stool, passing out, altered mental status, concern for dehydration such as dry mouth or not able to urinate, or for any other symptoms that are concerning to you. You should have close follow-up with primary care physician within the next 2 days.   Nausea and Vomiting Nausea means you feel sick to your stomach. Throwing up (vomiting) is a reflex where stomach contents come out of your mouth. HOME CARE   Take medicine as told by your doctor.  Do not force yourself to eat. However, you do need to drink fluids.  If you feel like eating, eat a normal diet as told by your doctor.  Eat rice, wheat, potatoes, bread, lean meats, yogurt, fruits, and vegetables.  Avoid high-fat foods.  Drink enough fluids to keep your pee (urine) clear or pale yellow.  Ask your doctor how to replace body fluid losses (rehydrate). Signs of body fluid loss (dehydration) include:  Feeling very thirsty.  Dry lips and mouth.  Feeling dizzy.  Dark pee.  Peeing less than normal.  Feeling confused.  Fast breathing or heart rate. GET HELP RIGHT AWAY IF:   You have blood in your throw up.  You have black or bloody poop (stool).  You have a bad headache or stiff neck.  You feel confused.  You have bad belly (abdominal) pain.  You have chest pain or trouble breathing.  You do not pee at least once every 8 hours.  You have cold, clammy skin.  You keep throwing up after 24 to 48 hours.  You have a fever. MAKE SURE YOU:   Understand these instructions.  Will watch your condition.  Will get help right away if you are not doing well or get worse. Document Released: 12/06/2007 Document  Revised: 09/11/2011 Document Reviewed: 11/18/2010 [facility] Patient Information 2015 [place], [place]. This information is not intended to replace advice given to you by your health care provider. Make sure you discuss any questions you have with your health care provider.  Diarrhea Diarrhea is frequent loose and watery bowel movements. It can cause you to feel weak and dehydrated. Dehydration can cause you to become tired and thirsty, have a dry mouth, and have decreased urination that often is dark yellow. Diarrhea is a sign of another problem, most often an infection that will not last long. In most cases, diarrhea typically lasts 2-3 days. However, it can last longer if it is a sign of something more serious. It is important to treat your diarrhea as directed by your caregiver to lessen or prevent future episodes of diarrhea. CAUSES  Some common causes include:  Gastrointestinal infections caused by viruses, bacteria, or parasites.  Food poisoning or food allergies.  Certain medicines, such as antibiotics, chemotherapy, and laxatives.  Artificial sweeteners and fructose.  Digestive disorders. HOME CARE INSTRUCTIONS  Ensure adequate fluid intake (hydration): Have 1 cup (8 oz) of fluid for each diarrhea episode. Avoid fluids that contain simple sugars or sports drinks, fruit juices, whole milk products, and sodas. Your urine should be clear or pale yellow if you are drinking enough fluids. Hydrate with an oral rehydration solution that you can purchase at pharmacies, retail stores, and online. You can prepare an  oral rehydration solution at home by mixing the following ingredients together:   - tsp table salt.   tsp baking soda.   tsp salt substitute containing potassium chloride.  1  tablespoons sugar.  1 L (34 oz) of water.  Certain foods and beverages may increase the speed at which food moves through the gastrointestinal (GI) tract. These foods and beverages should be avoided and  include:  Caffeinated and alcoholic beverages.  High-fiber foods, such as raw fruits and vegetables, nuts, seeds, and whole grain breads and cereals.  Foods and beverages sweetened with sugar alcohols, such as xylitol, sorbitol, and mannitol.  Some foods may be well tolerated and may help thicken stool including:  Starchy foods, such as rice, toast, pasta, low-sugar cereal, oatmeal, grits, baked potatoes, crackers, and bagels.  Bananas.  Applesauce.  Add probiotic-rich foods to help increase healthy bacteria in the GI tract, such as yogurt and fermented milk products.  Wash your hands well after each diarrhea episode.  Only take over-the-counter or prescription medicines as directed by your caregiver.  Take a warm bath to relieve any burning or pain from frequent diarrhea episodes. SEEK IMMEDIATE MEDICAL CARE IF:   You are unable to keep fluids down.  You have persistent vomiting.  You have blood in your stool, or your stools are black and tarry.  You do not urinate in 6-8 hours, or there is only a small amount of very dark urine.  You have abdominal pain that increases or localizes.  You have weakness, dizziness, confusion, or light-headedness.  You have a severe headache.  Your diarrhea gets worse or does not get better.  You have a fever or persistent symptoms for more than 2-3 days.  You have a fever and your symptoms suddenly get worse. MAKE SURE YOU:   Understand these instructions.  Will watch your condition.  Will get help right away if you are not doing well or get worse. Document Released: 06/09/2002 Document Revised: 11/03/2013 Document Reviewed: 02/25/2012 [facility] Patient Information 2015 [place], [place]. This information is not intended to replace advice given to you by your health care provider. Make sure you discuss any questions you have with your health care provider.

## 2015-03-04 NOTE — ED Notes (Signed)
Pt to triage with reports of vomiting that began this am with some lower abd pain, pt reports that she was vomiting yellow emesis but now it appears to be bloody. Pt also reports diarrhea.

## 2015-03-04 NOTE — ED Notes (Signed)
POC urine preg negative 
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
## 2015-03-06 LAB — POCT PREGNANCY, URINE: Preg Test, Ur: NEGATIVE

## 2015-04-20 ENCOUNTER — Emergency Department
Admission: EM | Admit: 2015-04-20 | Discharge: 2015-04-20 | Disposition: A | Discharge status: Home / Self Care | Payer: 59 | Attending: Emergency Medicine | Admitting: Emergency Medicine

## 2015-04-20 ENCOUNTER — Emergency Department: Payer: 59

## 2015-04-20 DIAGNOSIS — R1010 Upper abdominal pain, unspecified: Secondary | ICD-10-CM | POA: Insufficient documentation

## 2015-04-20 DIAGNOSIS — R197 Diarrhea, unspecified: Secondary | ICD-10-CM | POA: Diagnosis not present

## 2015-04-20 DIAGNOSIS — R1110 Vomiting, unspecified: Secondary | ICD-10-CM | POA: Diagnosis present

## 2015-04-20 LAB — COMPREHENSIVE METABOLIC PANEL
ALBUMIN: 4.7 g/dL (ref 3.5–5.0)
ALK PHOS: 47 U/L (ref 38–126)
ALT: 11 U/L — AB (ref 14–54)
AST: 18 U/L (ref 15–41)
Anion gap: 9 (ref 5–15)
BUN: 9 mg/dL (ref 6–20)
CALCIUM: 9.6 mg/dL (ref 8.9–10.3)
CO2: 27 mmol/L (ref 22–32)
CREATININE: 0.9 mg/dL (ref 0.44–1.00)
Chloride: 105 mmol/L (ref 101–111)
GFR calc non Af Amer: >60 mL/min (ref >60)
GLUCOSE: 102 mg/dL — AB (ref 65–99)
Potassium: 3.9 mmol/L (ref 3.5–5.1)
SODIUM: 141 mmol/L (ref 135–145)
Total Bilirubin: 0.7 mg/dL (ref 0.3–1.2)
Total Protein: 8 g/dL (ref 6.5–8.1)

## 2015-04-20 LAB — CBC WITH DIFFERENTIAL/PLATELET
Basophils Absolute: 0.3 10*3/uL — ABNORMAL HIGH (ref 0–0.1)
Basophils Relative: 3 %
EOS ABS: 0.3 10*3/uL (ref 0–0.7)
Eosinophils Relative: 3 %
HCT: 44.7 % (ref 35.0–47.0)
HEMOGLOBIN: 15.3 g/dL (ref 12.0–16.0)
LYMPHS ABS: 1.7 10*3/uL (ref 1.0–3.6)
Lymphocytes Relative: 16 %
MCH: 30.3 pg (ref 26.0–34.0)
MCHC: 34.2 g/dL (ref 32.0–36.0)
MCV: 88.6 fL (ref 80.0–100.0)
Monocytes Absolute: 0.9 10*3/uL (ref 0.2–0.9)
Monocytes Relative: 8 %
NEUTROS PCT: 70 %
Neutro Abs: 7.3 10*3/uL — ABNORMAL HIGH (ref 1.4–6.5)
Platelets: 233 10*3/uL (ref 150–440)
RBC: 5.04 MIL/uL (ref 3.80–5.20)
RDW: 13.4 % (ref 11.5–14.5)
WBC: 10.5 10*3/uL (ref 3.6–11.0)

## 2015-04-20 LAB — LIPASE, BLOOD: Lipase: 24 U/L (ref 22–51)

## 2015-04-20 MED ORDER — GI COCKTAIL ~~LOC~~
30.0000 mL | Freq: Once | ORAL | Status: AC
  Administered 2015-04-20: 30 mL via ORAL
  Filled 2015-04-20: qty 30

## 2015-04-20 MED ORDER — SODIUM CHLORIDE 0.9 % IV BOLUS (SEPSIS)
1000.0000 mL | Freq: Once | INTRAVENOUS | Status: AC
  Administered 2015-04-20: 1000 mL via INTRAVENOUS

## 2015-04-20 MED ORDER — ONDANSETRON HCL 4 MG/2ML IJ SOLN
4.0000 mg | Freq: Once | INTRAMUSCULAR | Status: AC
Start: 2015-04-20 — End: 2015-04-20
  Administered 2015-04-20: 4 mg via INTRAVENOUS
  Filled 2015-04-20: qty 2

## 2015-04-20 MED ORDER — OMEPRAZOLE 20 MG PO CPDR: 20.0000 mg | DELAYED_RELEASE_CAPSULE | Freq: Every day | ORAL | Status: DC

## 2015-04-20 NOTE — ED Notes (Signed)
MD at bedside for eval.

## 2015-04-20 NOTE — ED Notes (Signed)
Patient transported to [facility] 

## 2015-04-20 NOTE — ED Notes (Signed)
Report given to [facility], Therapist, sports.

## 2015-04-20 NOTE — ED Notes (Signed)
Pt in with co vomiting/ diarrhea and abd pain since yest.
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
## 2015-04-20 NOTE — Discharge Instructions (Signed)

## 2015-04-20 NOTE — ED Notes (Signed)
Prior to pt d/c, she stated she called to get Dr Tiffany Kocher consult per Dr Owens Shark, they said he was out of the office for a few weeks, pt stated she wanted to stay for a gastro consult and that the nurse on the phone said she should. This RN and Dr Marcelene Butte explained to pt that she was cleared to go home and that her consult can take place outpatient. Pt verbalized understanding, dr Nile Riggs gave pt name and number of another GI Dr she can call.

## 2015-04-26 ENCOUNTER — Encounter: Payer: Self-pay | Admitting: *Deleted

## 2015-04-26 ENCOUNTER — Emergency Department
Admission: EM | Admit: 2015-04-26 | Discharge: 2015-04-27 | Payer: 59 | Attending: Emergency Medicine | Admitting: Emergency Medicine

## 2015-04-26 DIAGNOSIS — Z79899 Other long term (current) drug therapy: Secondary | ICD-10-CM | POA: Diagnosis not present

## 2015-04-26 DIAGNOSIS — K2970 Gastritis, unspecified, without bleeding: Secondary | ICD-10-CM | POA: Diagnosis not present

## 2015-04-26 DIAGNOSIS — F17200 Nicotine dependence, unspecified, uncomplicated: Secondary | ICD-10-CM | POA: Diagnosis not present

## 2015-04-26 DIAGNOSIS — R1013 Epigastric pain: Secondary | ICD-10-CM | POA: Diagnosis present

## 2015-04-26 NOTE — ED Notes (Signed)
Pt reports abdominal pain, nausea and vomiting tonight. Pt states she was seen here for the same last week, saw her GI doctor, and tonight has had return of symptoms. Pt is in [employer] custody at this time

## 2015-04-27 MED ORDER — GI COCKTAIL ~~LOC~~
30.0000 mL | Freq: Once | ORAL | Status: AC
  Administered 2015-04-27: 30 mL via ORAL
  Filled 2015-04-27: qty 30

## 2015-04-27 MED ORDER — PANTOPRAZOLE SODIUM 40 MG PO TBEC
DELAYED_RELEASE_TABLET | ORAL | Status: AC
  Administered 2015-04-27: 40 mg via ORAL
  Filled 2015-04-27: qty 1

## 2015-04-27 MED ORDER — ONDANSETRON 4 MG PO TBDP
4.0000 mg | ORAL_TABLET | Freq: Once | ORAL | Status: AC
  Administered 2015-04-27: 4 mg via ORAL
  Filled 2015-04-27: qty 1

## 2015-04-27 MED ORDER — PANTOPRAZOLE SODIUM 40 MG PO TBEC
40.0000 mg | DELAYED_RELEASE_TABLET | Freq: Every day | ORAL | Status: DC
  Administered 2015-04-27: 40 mg via ORAL

## 2015-04-27 NOTE — ED Notes (Signed)
MD and nurse in with patient. Pt states " Please keep me here, I am not built for jail. Just put in an IV, I need fluids." MD explained why that was not medically needed.
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
## 2015-04-27 NOTE — Discharge Instructions (Signed)

## 2015-04-27 NOTE — ED Notes (Signed)
Pt in police custody, pt began to vomit while at the police station so was brought in by police to be evaluated

## 2015-07-05 DIAGNOSIS — J020 Streptococcal pharyngitis: Secondary | ICD-10-CM | POA: Diagnosis not present

## 2015-07-05 DIAGNOSIS — J029 Acute pharyngitis, unspecified: Secondary | ICD-10-CM | POA: Diagnosis not present

## 2015-08-16 DIAGNOSIS — Z332 Encounter for elective termination of pregnancy: Secondary | ICD-10-CM | POA: Diagnosis not present

## 2016-05-30 DIAGNOSIS — R05 Cough: Secondary | ICD-10-CM | POA: Diagnosis not present

## 2016-05-30 DIAGNOSIS — B9689 Other specified bacterial agents as the cause of diseases classified elsewhere: Secondary | ICD-10-CM | POA: Diagnosis not present

## 2016-05-30 DIAGNOSIS — J208 Acute bronchitis due to other specified organisms: Secondary | ICD-10-CM | POA: Diagnosis not present

## 2016-06-02 DIAGNOSIS — Z114 Encounter for screening for human immunodeficiency virus [HIV]: Secondary | ICD-10-CM | POA: Diagnosis not present

## 2016-06-02 DIAGNOSIS — R5383 Other fatigue: Secondary | ICD-10-CM | POA: Diagnosis not present

## 2016-06-19 DIAGNOSIS — H5213 Myopia, bilateral: Secondary | ICD-10-CM | POA: Diagnosis not present

## 2016-10-17 DIAGNOSIS — J069 Acute upper respiratory infection, unspecified: Secondary | ICD-10-CM | POA: Diagnosis not present

## 2016-10-17 DIAGNOSIS — L299 Pruritus, unspecified: Secondary | ICD-10-CM | POA: Diagnosis not present

## 2016-10-19 DIAGNOSIS — N926 Irregular menstruation, unspecified: Secondary | ICD-10-CM | POA: Diagnosis not present

## 2016-10-19 DIAGNOSIS — R350 Frequency of micturition: Secondary | ICD-10-CM | POA: Diagnosis not present

## 2016-10-19 DIAGNOSIS — K2960 Other gastritis without bleeding: Secondary | ICD-10-CM | POA: Diagnosis not present

## 2016-11-17 DIAGNOSIS — Z0000 Encounter for general adult medical examination without abnormal findings: Secondary | ICD-10-CM | POA: Diagnosis not present

## 2016-11-17 DIAGNOSIS — R69 Illness, unspecified: Secondary | ICD-10-CM | POA: Diagnosis not present

## 2016-11-17 DIAGNOSIS — K2970 Gastritis, unspecified, without bleeding: Secondary | ICD-10-CM | POA: Diagnosis not present

## 2016-11-17 DIAGNOSIS — E559 Vitamin D deficiency, unspecified: Secondary | ICD-10-CM | POA: Diagnosis not present

## 2016-11-23 DIAGNOSIS — B1089 Other human herpesvirus infection: Secondary | ICD-10-CM | POA: Diagnosis not present

## 2016-11-23 DIAGNOSIS — Z1159 Encounter for screening for other viral diseases: Secondary | ICD-10-CM | POA: Diagnosis not present

## 2016-11-23 DIAGNOSIS — Z23 Encounter for immunization: Secondary | ICD-10-CM | POA: Diagnosis not present

## 2016-12-15 DIAGNOSIS — R875 Abnormal microbiological findings in specimens from female genital organs: Secondary | ICD-10-CM | POA: Diagnosis not present

## 2016-12-15 DIAGNOSIS — Z124 Encounter for screening for malignant neoplasm of cervix: Secondary | ICD-10-CM | POA: Diagnosis not present

## 2016-12-15 DIAGNOSIS — N921 Excessive and frequent menstruation with irregular cycle: Secondary | ICD-10-CM | POA: Diagnosis not present

## 2016-12-15 DIAGNOSIS — Z0000 Encounter for general adult medical examination without abnormal findings: Secondary | ICD-10-CM | POA: Diagnosis not present

## 2017-04-02 ENCOUNTER — Encounter: Payer: Self-pay | Admitting: Emergency Medicine

## 2017-04-02 ENCOUNTER — Emergency Department
Admission: EM | Admit: 2017-04-02 | Discharge: 2017-04-02 | Disposition: A | Discharge status: Home / Self Care | Payer: 59 | Attending: Emergency Medicine | Admitting: Emergency Medicine

## 2017-04-02 DIAGNOSIS — F17200 Nicotine dependence, unspecified, uncomplicated: Secondary | ICD-10-CM | POA: Insufficient documentation

## 2017-04-02 DIAGNOSIS — Z79899 Other long term (current) drug therapy: Secondary | ICD-10-CM

## 2017-04-02 DIAGNOSIS — K529 Noninfective gastroenteritis and colitis, unspecified: Secondary | ICD-10-CM | POA: Insufficient documentation

## 2017-04-02 DIAGNOSIS — R112 Nausea with vomiting, unspecified: Secondary | ICD-10-CM

## 2017-04-02 DIAGNOSIS — R109 Unspecified abdominal pain: Secondary | ICD-10-CM | POA: Diagnosis present

## 2017-04-02 DIAGNOSIS — R1013 Epigastric pain: Secondary | ICD-10-CM | POA: Diagnosis not present

## 2017-04-02 DIAGNOSIS — K2960 Other gastritis without bleeding: Secondary | ICD-10-CM | POA: Diagnosis not present

## 2017-04-02 DIAGNOSIS — R197 Diarrhea, unspecified: Secondary | ICD-10-CM

## 2017-04-02 DIAGNOSIS — K2900 Acute gastritis without bleeding: Secondary | ICD-10-CM | POA: Insufficient documentation

## 2017-04-02 HISTORY — DX: Gastritis, unspecified, without bleeding: K29.70

## 2017-04-02 LAB — COMPREHENSIVE METABOLIC PANEL
ALT: 10 U/L — ABNORMAL LOW (ref 14–54)
ALT: 10 U/L — AB (ref 14–54)
ANION GAP: 8 (ref 5–15)
AST: 14 U/L — ABNORMAL LOW (ref 15–41)
AST: 18 U/L (ref 15–41)
Albumin: 4.4 g/dL (ref 3.5–5.0)
Albumin: 4.3 g/dL (ref 3.5–5.0)
Alkaline Phosphatase: 51 U/L (ref 38–126)
Alkaline Phosphatase: 50 U/L (ref 38–126)
Anion gap: 6 (ref 5–15)
BUN: 8 mg/dL (ref 6–20)
BUN: 7 mg/dL (ref 6–20)
CHLORIDE: 108 mmol/L (ref 101–111)
CHLORIDE: 111 mmol/L (ref 101–111)
CO2: 23 mmol/L (ref 22–32)
CO2: 23 mmol/L (ref 22–32)
CREATININE: 0.83 mg/dL (ref 0.44–1.00)
Calcium: 9 mg/dL (ref 8.9–10.3)
Calcium: 9 mg/dL (ref 8.9–10.3)
Creatinine, Ser: 0.76 mg/dL (ref 0.44–1.00)
GFR calc Af Amer: >60 mL/min (ref >60)
GFR calc non Af Amer: >60 mL/min (ref >60)
Glucose, Bld: 102 mg/dL — ABNORMAL HIGH (ref 65–99)
Glucose, Bld: 96 mg/dL (ref 65–99)
POTASSIUM: 3.5 mmol/L (ref 3.5–5.1)
Potassium: 3.5 mmol/L (ref 3.5–5.1)
SODIUM: 139 mmol/L (ref 135–145)
SODIUM: 140 mmol/L (ref 135–145)
Total Bilirubin: 0.6 mg/dL (ref 0.3–1.2)
Total Bilirubin: 0.6 mg/dL (ref 0.3–1.2)
Total Protein: 7.6 g/dL (ref 6.5–8.1)
Total Protein: 7.2 g/dL (ref 6.5–8.1)

## 2017-04-02 LAB — CBC
HCT: 39.6 % (ref 35.0–47.0)
HEMATOCRIT: 39.2 % (ref 35.0–47.0)
HEMOGLOBIN: 14 g/dL (ref 12.0–16.0)
Hemoglobin: 13.9 g/dL (ref 12.0–16.0)
MCH: 30.8 pg (ref 26.0–34.0)
MCH: 30.4 pg (ref 26.0–34.0)
MCHC: 35.7 g/dL (ref 32.0–36.0)
MCHC: 35.2 g/dL (ref 32.0–36.0)
MCV: 86.4 fL (ref 80.0–100.0)
MCV: 86.3 fL (ref 80.0–100.0)
PLATELETS: 266 10*3/uL (ref 150–440)
Platelets: 242 10*3/uL (ref 150–440)
RBC: 4.54 MIL/uL (ref 3.80–5.20)
RBC: 4.59 MIL/uL (ref 3.80–5.20)
RDW: 13.3 % (ref 11.5–14.5)
RDW: 13.4 % (ref 11.5–14.5)
WBC: 8.2 10*3/uL (ref 3.6–11.0)
WBC: 10.4 10*3/uL (ref 3.6–11.0)

## 2017-04-02 LAB — URINALYSIS, COMPLETE (UACMP) WITH MICROSCOPIC
BACTERIA UA: NONE SEEN
Bilirubin Urine: NEGATIVE
Glucose, UA: NEGATIVE mg/dL
Hgb urine dipstick: NEGATIVE
Ketones, ur: NEGATIVE mg/dL
LEUKOCYTES UA: NEGATIVE
Nitrite: NEGATIVE
PH: 6 (ref 5.0–8.0)
Protein, ur: NEGATIVE mg/dL
RBC / HPF: NONE SEEN RBC/hpf (ref 0–5)
SPECIFIC GRAVITY, URINE: 1.005 (ref 1.005–1.030)

## 2017-04-02 LAB — LIPASE, BLOOD
LIPASE: 21 U/L (ref 11–51)
LIPASE: 19 U/L (ref 11–51)

## 2017-04-02 LAB — POCT PREGNANCY, URINE: Preg Test, Ur: NEGATIVE

## 2017-04-02 MED ORDER — ONDANSETRON HCL 4 MG/2ML IJ SOLN
4.0000 mg | Freq: Once | INTRAMUSCULAR | Status: AC
  Administered 2017-04-02: 4 mg via INTRAVENOUS

## 2017-04-02 MED ORDER — GI COCKTAIL ~~LOC~~
30.0000 mL | Freq: Once | ORAL | Status: AC
  Administered 2017-04-02: 30 mL via ORAL
  Filled 2017-04-02: qty 30

## 2017-04-02 MED ORDER — ONDANSETRON HCL 4 MG/2ML IJ SOLN
4.0000 mg | Freq: Once | INTRAMUSCULAR | Status: AC
  Administered 2017-04-02: 4 mg via INTRAVENOUS
  Filled 2017-04-02: qty 2

## 2017-04-02 MED ORDER — ONDANSETRON HCL 4 MG/2ML IJ SOLN
INTRAMUSCULAR | Status: AC
  Filled 2017-04-02: qty 2

## 2017-04-02 MED ORDER — SODIUM CHLORIDE 0.9 % IV BOLUS (SEPSIS)
1000.0000 mL | Freq: Once | INTRAVENOUS | Status: AC
  Administered 2017-04-02: 1000 mL via INTRAVENOUS

## 2017-04-02 MED ORDER — ONDANSETRON HCL 4 MG/2ML IJ SOLN
4.0000 mg | Freq: Once | INTRAMUSCULAR | Status: AC | PRN
  Administered 2017-04-02: 4 mg via INTRAVENOUS
  Filled 2017-04-02: qty 2

## 2017-04-02 MED ORDER — FAMOTIDINE IN NACL 20-0.9 MG/50ML-% IV SOLN
20.0000 mg | Freq: Once | INTRAVENOUS | Status: AC
  Administered 2017-04-02: 20 mg via INTRAVENOUS
  Filled 2017-04-02: qty 50

## 2017-04-02 MED ORDER — ACETAMINOPHEN 500 MG PO TABS
1000.0000 mg | ORAL_TABLET | Freq: Once | ORAL | Status: AC
  Administered 2017-04-02: 1000 mg via ORAL
  Filled 2017-04-02: qty 2

## 2017-04-02 MED ORDER — ONDANSETRON 4 MG PO TBDP
4.0000 mg | ORAL_TABLET | Freq: Once | ORAL | Status: AC | PRN
  Administered 2017-04-02: 4 mg via ORAL
  Filled 2017-04-02: qty 1

## 2017-04-02 MED ORDER — ONDANSETRON 4 MG PO TBDP: 4.0000 mg | ORAL_TABLET | Freq: Three times a day (TID) | ORAL | 0 refills | Status: DC | PRN

## 2017-04-02 MED ORDER — FENTANYL CITRATE (PF) 100 MCG/2ML IJ SOLN
50.0000 ug | INTRAMUSCULAR | Status: DC | PRN
  Administered 2017-04-02: 50 ug via INTRAVENOUS
  Filled 2017-04-02: qty 2

## 2017-04-02 NOTE — ED Provider Notes (Signed)
[facility] Emergency Department Provider Note  ____________________________________________  Time seen: Approximately 7:39 AM  I have reviewed the triage vital signs and the nursing notes.   HISTORY  Chief Complaint Abdominal Pain   HPI Rose Campos is a 26 y.o. female with a history of gastritis who presents for evaluation of abdominal pain. Patient reports 2 days of cramping abdominal pain associated with nausea, several daily episodes of nonbloody nonbilious emesis and watery diarrhea. Her symptoms got better yesterday but this morning when patient woke up she was having sharp cramp-like severe pain located in her epigastric region, currently 5/10, constant and nonradiating, associated with nausea and several episodes of nonbloody nonbilious emesis. No longer having diarrhea. No fever or chills, no melena, hematemesis, hematochezia. No NSAID use. No alcohol use. No prior abdominal surgeries. No dysuria or hematuria. No chest pain, shortness of breath, or URI symptoms.   Past Medical History:  Diagnosis Date  . Gastritis     Patient Active Problem List   Diagnosis Date Noted  . ALLERGIC RHINITIS DUE TO POLLEN 01/05/2009  . CHICKENPOX, HX OF 01/05/2009    History reviewed. No pertinent surgical history.  Prior to Admission medications   Medication Sig Start Date End Date Taking? Authorizing Provider  sucralfate (CARAFATE) 1 g tablet Take 1 g by mouth daily as needed. 10/19/16 10/19/17 Yes [provider]  omeprazole (PRILOSEC) 20 MG capsule Take 1 capsule (20 mg total) by mouth daily. Patient not taking: Reported on 04/02/2017 04/20/15   Gregor Hams, MD  ondansetron (ZOFRAN ODT) 4 MG disintegrating tablet Take 1 tablet (4 mg total) by mouth every 8 (eight) hours as needed for nausea or vomiting. 04/02/17   Rudene Re, MD  ondansetron (ZOFRAN) 4 MG tablet Take 1 tablet (4 mg total) by mouth every 8 (eight) hours as needed for  nausea or vomiting. Patient not taking: Reported on 04/02/2017 03/04/15   Lisa Roca, MD    Allergies Patient has no known allergies.  No family history on file.  Social History Social History  Substance Use Topics  . Smoking status: Current Some Day Smoker  . Smokeless tobacco: Never Used  . Alcohol use Yes    Review of Systems  Constitutional: Negative for fever. Eyes: Negative for visual changes. ENT: Negative for sore throat. Neck: No neck pain  Cardiovascular: Negative for chest pain. Respiratory: Negative for shortness of breath. Gastrointestinal: + epigastric abdominal pain, vomiting and diarrhea. Genitourinary: Negative for dysuria. Musculoskeletal: Negative for back pain. Skin: Negative for rash. Neurological: Negative for headaches, weakness or numbness. Psych: No SI or HI  ____________________________________________   PHYSICAL EXAM:  VITAL SIGNS: ED Triage Vitals [04/02/17 0645]  Enc Vitals Group     BP 113/67     Pulse Rate 77     Resp 18     Temp 97.9 F (36.6 C)     Temp Source Oral     SpO2 98 %     Weight      Height      Head Circumference      Peak Flow      Pain Score 7     Pain Loc      Pain Edu?      Excl. in [place]?     Constitutional: Alert and oriented. Well appearing and in no apparent distress. HEENT:      Head: Normocephalic and atraumatic.         Eyes: Conjunctivae are normal. Sclera  is non-icteric.       Mouth/Throat: Mucous membranes are moist.       Neck: Supple with no signs of meningismus. Cardiovascular: Regular rate and rhythm. No murmurs, gallops, or rubs. 2+ symmetrical distal pulses are present in all extremities. No JVD. Respiratory: Normal respiratory effort. Lungs are clear to auscultation bilaterally. No wheezes, crackles, or rhonchi.  Gastrointestinal: Soft, palpation of the epigastrium and LUQ show no tenderness but worsening nausea, non tender, and non distended with positive bowel sounds. No rebound or  guarding. Genitourinary: No CVA tenderness. Musculoskeletal: Nontender with normal range of motion in all extremities. No edema, cyanosis, or erythema of extremities. Neurologic: Normal speech and language. Face is symmetric. Moving all extremities. No gross focal neurologic deficits are appreciated. Skin: Skin is warm, dry and intact. No rash noted. Psychiatric: Mood and affect are normal. Speech and behavior are normal.  ____________________________________________   LABS (all labs ordered are listed, but only abnormal results are displayed)  Labs Reviewed  COMPREHENSIVE METABOLIC PANEL - Abnormal; Notable for the following:       Result Value   Glucose, Bld 102 (*)    AST 14 (*)    ALT 10 (*)    All other components within normal limits  URINALYSIS, COMPLETE (UACMP) WITH MICROSCOPIC - Abnormal; Notable for the following:    Color, Urine STRAW (*)    APPearance CLEAR (*)    Squamous Epithelial / LPF 0-5 (*)    All other components within normal limits  LIPASE, BLOOD  CBC  POCT PREGNANCY, URINE   ____________________________________________  EKG  none  ____________________________________________  RADIOLOGY  none  ____________________________________________   PROCEDURES  Procedure(s) performed: None Procedures Critical Care performed:  None ____________________________________________   INITIAL IMPRESSION / ASSESSMENT AND PLAN / ED COURSE   26 y.o. female with a history of gastritis who presents for evaluation of epigastric abdominal pain, nausea, and vomiting in the setting recent viral gastroenteritis. Patient extremely well appearing, in no distress, is normal vital signs, palpation of her epigastrium and left upper quadrant causes nausea but patient has no tenderness, normal bowel sounds, no distention. Differential diagnosis including gastritis versus viral gastroenteritis versus pregnancy versus UTI. Patient has no tenderness in the right upper quadrant,  normal LFTs, lipase, and white count therefore cholecystitis less likely. patient has no lower abdominal tenderness with no evidence of appendicitis or ovarian etiology at this time. We'll treat with IV fluids, Zofran, GI cocktail, Pepcid and Tylenol. Pregnancy test and UA are pending.    _________________________ 9:48 AM on 04/02/2017 -----------------------------------------  patient feels markedly improved. No longer having any abdominal discomfort, no nausea, tolerating by mouth, no episodes of diarrhea in the emergency room. Labs within normal limits. UA negative for infection or ketones. Pregnancy test is negative. Patient's condition discharged home with follow-up with her GI doctor and with a prescription for Zofran. Discussed BRAT diet for the next 24-48 hours and increased PO hydration. discussed return precautions with patient.  Pertinent labs & imaging results that were available during my care of the patient were reviewed by me and considered in my medical decision making (see chart for details).    ____________________________________________   FINAL CLINICAL IMPRESSION(S) / ED DIAGNOSES  Final diagnoses:  Nausea vomiting and diarrhea  Epigastric pain      NEW MEDICATIONS STARTED DURING THIS VISIT:  New Prescriptions   ONDANSETRON (ZOFRAN ODT) 4 MG DISINTEGRATING TABLET    Take 1 tablet (4 mg total) by mouth every 8 (eight)  hours as needed for nausea or vomiting.     Note:  This document was prepared using Dragon voice recognition software and may include unintentional dictation errors.    Rudene Re, MD 04/02/17 [phone]

## 2017-04-02 NOTE — Discharge Instructions (Signed)

## 2017-04-02 NOTE — ED Triage Notes (Signed)
PAtient arrives from home with co mid epigastric pain.  Pt has hx of gastritis and she feels she is haviong a flare up like she has had before.  She has had diarrhea 3 days ago but it is forming stool now.  She is nauseous today and had vomited x 3 this morning

## 2017-04-02 NOTE — ED Triage Notes (Signed)
Patient to ER from home for c/o abd pain with N/V/D since Friday night. States she was seen here earlier and sent home after GI cocktail. States since then, symptoms have not improved. Unable to count how many episodes of emesis she has had since being seen earlier today. States stools are mostly liquid, but have some formed stool as well. +Nausea currently.

## 2017-04-03 ENCOUNTER — Emergency Department
Admission: EM | Admit: 2017-04-03 | Discharge: 2017-04-03 | Disposition: A | Discharge status: Home / Self Care | Payer: 59 | Source: Home / Self Care | Attending: Emergency Medicine | Admitting: Emergency Medicine

## 2017-04-03 ENCOUNTER — Telehealth: Payer: Self-pay | Admitting: Medical Oncology

## 2017-04-03 DIAGNOSIS — K529 Noninfective gastroenteritis and colitis, unspecified: Secondary | ICD-10-CM

## 2017-04-03 DIAGNOSIS — R1013 Epigastric pain: Secondary | ICD-10-CM | POA: Diagnosis not present

## 2017-04-03 DIAGNOSIS — R112 Nausea with vomiting, unspecified: Secondary | ICD-10-CM | POA: Diagnosis not present

## 2017-04-03 DIAGNOSIS — K2960 Other gastritis without bleeding: Secondary | ICD-10-CM

## 2017-04-03 DIAGNOSIS — F17200 Nicotine dependence, unspecified, uncomplicated: Secondary | ICD-10-CM | POA: Diagnosis not present

## 2017-04-03 MED ORDER — SUCRALFATE 1 G PO TABS
1.0000 g | ORAL_TABLET | Freq: Once | ORAL | Status: AC
  Administered 2017-04-03: 1 g via ORAL
  Filled 2017-04-03: qty 1

## 2017-04-03 MED ORDER — GI COCKTAIL ~~LOC~~
30.0000 mL | Freq: Once | ORAL | Status: AC
  Administered 2017-04-03: 30 mL via ORAL
  Filled 2017-04-03: qty 30

## 2017-04-03 MED ORDER — SUCRALFATE 1 GM/10ML PO SUSP: 1.0000 g | Freq: Four times a day (QID) | ORAL | 0 refills | Status: DC

## 2017-04-03 MED ORDER — METOCLOPRAMIDE HCL 10 MG PO TABS
10.0000 mg | ORAL_TABLET | Freq: Once | ORAL | Status: AC
  Administered 2017-04-03: 10 mg via ORAL
  Filled 2017-04-03: qty 1

## 2017-04-03 MED ORDER — FAMOTIDINE 40 MG PO TABS: 40.0000 mg | ORAL_TABLET | Freq: Every evening | ORAL | 0 refills | Status: DC

## 2017-04-03 MED ORDER — FAMOTIDINE 20 MG PO TABS
40.0000 mg | ORAL_TABLET | Freq: Once | ORAL | Status: AC
  Administered 2017-04-03: 40 mg via ORAL
  Filled 2017-04-03: qty 2

## 2017-04-03 MED ORDER — METOCLOPRAMIDE HCL 10 MG PO TABS: 10.0000 mg | ORAL_TABLET | Freq: Three times a day (TID) | ORAL | 0 refills | Status: DC | PRN

## 2017-04-03 NOTE — ED Provider Notes (Signed)
[facility] Emergency Department Provider Note   ____________________________________________   First MD Initiated Contact with Patient 04/03/17 0014     (approximate)  I have reviewed the triage vital signs and the nursing notes.   HISTORY  Chief Complaint Emesis and Abdominal Pain    HPI Rose Campos is a 26 y.o. female who comes into the hospital today with some severe abdominal pain nausea and vomiting. She reports that it started on Saturday. She reports that the pain is her mid to left upper abdomen. She is also had some diarrhea. She has vomited a couple times in its yellow and a runny and brown diarrhea. The patient has not taken anything for pain. She denies any sick contacts. The patient was seen earlier with the same symptoms and discharged home but she reports that she is unable to sleep. The patient had similar episode in 2015 and ended up missing school for 2 weeks. She reports that she was severely sick but after seeing GI was told that she had gastritis. She reports that she has not continued taking any stomach medicine since then. The patient states that this episode of abdominal pain nausea and vomiting started after she had some spicy chicken. The patient reports that she has been trying to take Carafate dissolved in water but it has not helped. She has been taking Zofran as well. The patient decided to come back in for further evaluation.   Past Medical History:  Diagnosis Date  . Gastritis     Patient Active Problem List   Diagnosis Date Noted  . ALLERGIC RHINITIS DUE TO POLLEN 01/05/2009  . CHICKENPOX, HX OF 01/05/2009    History reviewed. No pertinent surgical history.  Prior to Admission medications   Medication Sig Start Date End Date Taking? Authorizing Provider  famotidine (PEPCID) 40 MG tablet Take 1 tablet (40 mg total) by mouth every evening. 04/03/17 04/03/18  Loney Hering, MD  metoCLOPramide (REGLAN) 10 MG  tablet Take 1 tablet (10 mg total) by mouth every 8 (eight) hours as needed. 04/03/17   Loney Hering, MD  omeprazole (PRILOSEC) 20 MG capsule Take 1 capsule (20 mg total) by mouth daily. Patient not taking: Reported on 04/02/2017 04/20/15   Gregor Hams, MD  ondansetron (ZOFRAN ODT) 4 MG disintegrating tablet Take 1 tablet (4 mg total) by mouth every 8 (eight) hours as needed for nausea or vomiting. 04/02/17   Rudene Re, MD  ondansetron (ZOFRAN) 4 MG tablet Take 1 tablet (4 mg total) by mouth every 8 (eight) hours as needed for nausea or vomiting. Patient not taking: Reported on 04/02/2017 03/04/15   Lisa Roca, MD  sucralfate (CARAFATE) 1 g tablet Take 1 g by mouth daily as needed. 10/19/16 10/19/17  [provider]  sucralfate (CARAFATE) 1 GM/10ML suspension Take 10 mLs (1 g total) by mouth 4 (four) times daily. 04/03/17 04/03/18  Loney Hering, MD    Allergies Patient has no known allergies.  No family history on file.  Social History Social History  Substance Use Topics  . Smoking status: Current Some Day Smoker  . Smokeless tobacco: Never Used  . Alcohol use Yes    Review of Systems  Constitutional: No fever/chills Eyes: No visual changes. ENT: No sore throat. Cardiovascular: Denies chest pain. Respiratory: Denies shortness of breath. Gastrointestinal:  abdominal pain.   nausea,  vomiting.   diarrhea.  No constipation. Genitourinary: Negative for dysuria. Musculoskeletal: Negative for back pain. Skin: Negative  for rash. Neurological: Negative for headaches, focal weakness or numbness.   ____________________________________________   PHYSICAL EXAM:  VITAL SIGNS: ED Triage Vitals [04/02/17 2153]  Enc Vitals Group     BP 135/78     Pulse Rate 78     Resp 18     Temp 98.4 F (36.9 C)     Temp Source Oral     SpO2 100 %     Weight 175 lb (79.4 kg)     Height 5\' 10"  (1.778 m)     Head Circumference      Peak Flow      Pain Score 9      Pain Loc      Pain Edu?      Excl. in [place]?     Constitutional: Alert and oriented. Well appearing and in moderate distress. Eyes: Conjunctivae are normal. PERRL. EOMI. Head: Atraumatic. Nose: No congestion/rhinnorhea. Mouth/Throat: Mucous membranes are moist.  Oropharynx non-erythematous. Cardiovascular: Normal rate, regular rhythm. Grossly normal heart sounds.  Good peripheral circulation. Respiratory: Normal respiratory effort.  No retractions. Lungs CTAB. Gastrointestinal: Soft with left upper quadrant tenderness to palpation. No distention. positive bowel sounds Musculoskeletal: No lower extremity tenderness nor edema.   Neurologic:  Normal speech and language.  Skin:  Skin is warm, dry and intact.  Psychiatric: Mood and affect are normal.   ____________________________________________   LABS (all labs ordered are listed, but only abnormal results are displayed)  Labs Reviewed  COMPREHENSIVE METABOLIC PANEL - Abnormal; Notable for the following:       Result Value   ALT 10 (*)    All other components within normal limits  LIPASE, BLOOD  CBC   ____________________________________________  EKG  none ____________________________________________  RADIOLOGY  No results found.  ____________________________________________   PROCEDURES  Procedure(s) performed: None  Procedures  Critical Care performed: No  ____________________________________________   INITIAL IMPRESSION / ASSESSMENT AND PLAN / ED COURSE  Pertinent labs & imaging results that were available during my care of the patient were reviewed by me and considered in my medical decision making (see chart for details).  This is a 26 year old female who comes into the hospital today with severe abdominal pain.  My differential diagnosis includes gastritis, gastroenteritis, ulcer  The patient's blood work is unremarkable I did give the patient has GI cocktail, Carafate and Pepcid. We had a long  conversation about her symptoms and the need for her to take occasions regularly. The patient reports that every morning she wakes up with what seems like heartburn but she said that she didn't consider it reflux because she never tasted it in her mouth nor did she burp it up. The patient also received a dose of Reglanand some Zofran. We initially talked about if we wanted to do any imaging studies although I feel this is more consistent with a gastritis she has had in the past. The patient states that she doesn't want any studies but she will try to follow-up with Dr. Vira Agar. The patient will be discharged to home to follow-up with Dr. Vira Agar her gastroenterologist.      ____________________________________________   FINAL CLINICAL IMPRESSION(S) / ED DIAGNOSES  Final diagnoses:  Other gastritis without hemorrhage, unspecified chronicity  Gastroenteritis      NEW MEDICATIONS STARTED DURING THIS VISIT:  Discharge Medication List as of 04/03/2017  2:08 AM    START taking these medications   Details  famotidine (PEPCID) 40 MG tablet Take 1 tablet (40 mg total) by mouth  every evening., Starting Tue 04/03/2017, Until Wed 04/03/2018, Print    metoCLOPramide (REGLAN) 10 MG tablet Take 1 tablet (10 mg total) by mouth every 8 (eight) hours as needed., Starting Tue 04/03/2017, Print    sucralfate (CARAFATE) 1 GM/10ML suspension Take 10 mLs (1 g total) by mouth 4 (four) times daily., Starting Tue 04/03/2017, Until Wed 04/03/2018, Print         Note:  This document was prepared using Dragon voice recognition software and may include unintentional dictation errors.    Loney Hering, MD 04/03/17 [phone]

## 2017-04-03 NOTE — ED Notes (Signed)
Pt eating ice chips

## 2017-04-03 NOTE — Discharge Instructions (Signed)
Please follow up with your GI physician for further evaluation of her symptoms.

## 2017-04-03 NOTE — Telephone Encounter (Signed)
Pt left with IV in arm last night after discharge, pt has returned to ED to have IV removed. IV removed by this RN.

## 2017-04-03 NOTE — ED Notes (Signed)
Pt to the er for abdominal pain, n/v/d. Multiple episodes today. Diarrhea brown in color. Pt says she has a history of gastritis and she is afraid something else is going on. Pt admits to eating something spicy yesterday but denies reflux. Pt has pain over the middle part of belly.

## 2017-04-10 DIAGNOSIS — K2960 Other gastritis without bleeding: Secondary | ICD-10-CM | POA: Diagnosis not present

## 2017-04-10 DIAGNOSIS — K219 Gastro-esophageal reflux disease without esophagitis: Secondary | ICD-10-CM | POA: Diagnosis not present

## 2017-04-10 DIAGNOSIS — Z01419 Encounter for gynecological examination (general) (routine) without abnormal findings: Secondary | ICD-10-CM | POA: Diagnosis not present

## 2017-04-19 DIAGNOSIS — K2970 Gastritis, unspecified, without bleeding: Secondary | ICD-10-CM | POA: Diagnosis not present

## 2017-04-19 DIAGNOSIS — K3189 Other diseases of stomach and duodenum: Secondary | ICD-10-CM | POA: Diagnosis not present

## 2017-04-19 DIAGNOSIS — K2950 Unspecified chronic gastritis without bleeding: Secondary | ICD-10-CM | POA: Diagnosis not present

## 2017-04-19 DIAGNOSIS — K219 Gastro-esophageal reflux disease without esophagitis: Secondary | ICD-10-CM | POA: Diagnosis not present

## 2017-04-19 DIAGNOSIS — K449 Diaphragmatic hernia without obstruction or gangrene: Secondary | ICD-10-CM | POA: Diagnosis not present

## 2017-04-19 DIAGNOSIS — K2960 Other gastritis without bleeding: Secondary | ICD-10-CM | POA: Diagnosis not present

## 2017-08-22 ENCOUNTER — Emergency Department: Admission: EM | Admit: 2017-08-22 | Discharge: 2017-08-22 | Discharge status: Against Medical Advice | Payer: 59

## 2017-08-23 DIAGNOSIS — M79674 Pain in right toe(s): Secondary | ICD-10-CM | POA: Diagnosis not present

## 2017-08-23 DIAGNOSIS — W010XXA Fall on same level from slipping, tripping and stumbling without subsequent striking against object, initial encounter: Secondary | ICD-10-CM | POA: Diagnosis not present

## 2017-08-23 DIAGNOSIS — S92424A Nondisplaced fracture of distal phalanx of right great toe, initial encounter for closed fracture: Secondary | ICD-10-CM | POA: Diagnosis not present

## 2017-08-23 DIAGNOSIS — S92414A Nondisplaced fracture of proximal phalanx of right great toe, initial encounter for closed fracture: Secondary | ICD-10-CM | POA: Diagnosis not present

## 2017-08-31 DIAGNOSIS — S92424D Nondisplaced fracture of distal phalanx of right great toe, subsequent encounter for fracture with routine healing: Secondary | ICD-10-CM | POA: Diagnosis not present

## 2017-08-31 DIAGNOSIS — M79671 Pain in right foot: Secondary | ICD-10-CM | POA: Diagnosis not present

## 2017-09-05 ENCOUNTER — Ambulatory Visit: Payer: 59 | Admitting: Internal Medicine

## 2017-09-05 DIAGNOSIS — Z0289 Encounter for other administrative examinations: Secondary | ICD-10-CM

## 2017-09-11 DIAGNOSIS — K2960 Other gastritis without bleeding: Secondary | ICD-10-CM | POA: Diagnosis not present

## 2017-09-11 DIAGNOSIS — R112 Nausea with vomiting, unspecified: Secondary | ICD-10-CM | POA: Diagnosis not present

## 2017-09-11 DIAGNOSIS — R197 Diarrhea, unspecified: Secondary | ICD-10-CM | POA: Diagnosis not present

## 2017-09-24 DIAGNOSIS — S92424D Nondisplaced fracture of distal phalanx of right great toe, subsequent encounter for fracture with routine healing: Secondary | ICD-10-CM | POA: Diagnosis not present

## 2018-01-15 DIAGNOSIS — K640 First degree hemorrhoids: Secondary | ICD-10-CM | POA: Diagnosis not present

## 2018-01-15 DIAGNOSIS — K648 Other hemorrhoids: Secondary | ICD-10-CM | POA: Diagnosis not present

## 2018-01-15 DIAGNOSIS — R197 Diarrhea, unspecified: Secondary | ICD-10-CM | POA: Diagnosis not present

## 2018-01-15 DIAGNOSIS — D126 Benign neoplasm of colon, unspecified: Secondary | ICD-10-CM | POA: Diagnosis not present

## 2018-01-15 DIAGNOSIS — D124 Benign neoplasm of descending colon: Secondary | ICD-10-CM | POA: Diagnosis not present

## 2018-02-10 ENCOUNTER — Emergency Department: Payer: 59

## 2018-02-10 ENCOUNTER — Emergency Department
Admission: EM | Admit: 2018-02-10 | Discharge: 2018-02-10 | Disposition: A | Discharge status: Home / Self Care | Payer: 59 | Attending: Emergency Medicine | Admitting: Emergency Medicine

## 2018-02-10 ENCOUNTER — Other Ambulatory Visit: Payer: Self-pay

## 2018-02-10 ENCOUNTER — Encounter: Payer: Self-pay | Admitting: Emergency Medicine

## 2018-02-10 DIAGNOSIS — R1033 Periumbilical pain: Secondary | ICD-10-CM | POA: Diagnosis not present

## 2018-02-10 DIAGNOSIS — R112 Nausea with vomiting, unspecified: Secondary | ICD-10-CM | POA: Insufficient documentation

## 2018-02-10 DIAGNOSIS — F17200 Nicotine dependence, unspecified, uncomplicated: Secondary | ICD-10-CM | POA: Insufficient documentation

## 2018-02-10 DIAGNOSIS — Z79899 Other long term (current) drug therapy: Secondary | ICD-10-CM | POA: Insufficient documentation

## 2018-02-10 DIAGNOSIS — R1030 Lower abdominal pain, unspecified: Secondary | ICD-10-CM

## 2018-02-10 DIAGNOSIS — R109 Unspecified abdominal pain: Secondary | ICD-10-CM | POA: Diagnosis not present

## 2018-02-10 LAB — COMPREHENSIVE METABOLIC PANEL
ALBUMIN: 4.3 g/dL (ref 3.5–5.0)
ALT: 12 U/L (ref 0–44)
ANION GAP: 9 (ref 5–15)
AST: 17 U/L (ref 15–41)
Alkaline Phosphatase: 47 U/L (ref 38–126)
BILIRUBIN TOTAL: 0.4 mg/dL (ref 0.3–1.2)
BUN: 6 mg/dL (ref 6–20)
CALCIUM: 8.7 mg/dL — AB (ref 8.9–10.3)
CHLORIDE: 111 mmol/L (ref 98–111)
CO2: 26 mmol/L (ref 22–32)
CREATININE: 0.86 mg/dL (ref 0.44–1.00)
GFR calc Af Amer: >60 mL/min (ref >60)
GFR calc non Af Amer: >60 mL/min (ref >60)
GLUCOSE: 108 mg/dL — AB (ref 70–99)
POTASSIUM: 3.9 mmol/L (ref 3.5–5.1)
SODIUM: 146 mmol/L — AB (ref 135–145)
Total Protein: 7.4 g/dL (ref 6.5–8.1)

## 2018-02-10 LAB — URINALYSIS, COMPLETE (UACMP) WITH MICROSCOPIC
Bacteria, UA: NONE SEEN
Bilirubin Urine: NEGATIVE
GLUCOSE, UA: NEGATIVE mg/dL
Ketones, ur: NEGATIVE mg/dL
Leukocytes, UA: NEGATIVE
Nitrite: NEGATIVE
PH: 7 (ref 5.0–8.0)
Protein, ur: NEGATIVE mg/dL
SPECIFIC GRAVITY, URINE: 1.015 (ref 1.005–1.030)
SQUAMOUS EPITHELIAL / LPF: NONE SEEN (ref 0–5)

## 2018-02-10 LAB — CBC
HCT: 42 % (ref 35.0–47.0)
Hemoglobin: 14.4 g/dL (ref 12.0–16.0)
MCH: 30.7 pg (ref 26.0–34.0)
MCHC: 34.4 g/dL (ref 32.0–36.0)
MCV: 89.2 fL (ref 80.0–100.0)
PLATELETS: 268 10*3/uL (ref 150–440)
RBC: 4.7 MIL/uL (ref 3.80–5.20)
RDW: 13.9 % (ref 11.5–14.5)
WBC: 6.7 10*3/uL (ref 3.6–11.0)

## 2018-02-10 LAB — POCT PREGNANCY, URINE: Preg Test, Ur: NEGATIVE

## 2018-02-10 LAB — LIPASE, BLOOD: LIPASE: 20 U/L (ref 11–51)

## 2018-02-10 MED ORDER — POLYETHYLENE GLYCOL 3350 17 GM/SCOOP PO POWD: 17.0000 g | Freq: Every day | ORAL | 0 refills | Status: DC | PRN

## 2018-02-10 MED ORDER — DICYCLOMINE HCL 10 MG PO CAPS
20.0000 mg | ORAL_CAPSULE | Freq: Once | ORAL | Status: AC
  Administered 2018-02-10: 20 mg via ORAL
  Filled 2018-02-10: qty 2

## 2018-02-10 MED ORDER — ONDANSETRON 4 MG PO TBDP
4.0000 mg | ORAL_TABLET | Freq: Once | ORAL | Status: AC
  Administered 2018-02-10: 4 mg via ORAL
  Filled 2018-02-10: qty 1

## 2018-02-10 MED ORDER — DICYCLOMINE HCL 20 MG PO TABS: 20.0000 mg | ORAL_TABLET | Freq: Three times a day (TID) | ORAL | 0 refills | Status: DC | PRN

## 2018-02-10 MED ORDER — ONDANSETRON 4 MG PO TBDP: 4.0000 mg | ORAL_TABLET | Freq: Three times a day (TID) | ORAL | 0 refills | Status: DC | PRN

## 2018-02-10 NOTE — ED Provider Notes (Signed)
[facility] Emergency Department Provider Note  Time seen: 2:28 PM  I have reviewed the triage vital signs and the nursing notes.   HISTORY  Chief Complaint Abdominal Pain    HPI Rose Campos is a 27 y.o. female with a past medical history of gastritis presents to the emergency department for intermittent abdominal pain.  According to the patient she normally has a bowel movement each day, today she has not been able to have a bowel movement.  States she has been nauseated with occasional episodes of vomiting this morning as well.  Patient describes her abdominal pain as central, periumbilical type pain that comes and goes.  States it is severe when it occurs it will last for several minutes and then resolved.  Currently denies any pain.  States she will occasionally get a sensation like she needs to have a bowel movement but when she tries nothing comes out.  Denies any rectal pain.  Denies any dysuria or hematuria, denies vaginal discharge.  Patient is currently on her menstrual cycle.   Past Medical History:  Diagnosis Date  . Gastritis     Patient Active Problem List   Diagnosis Date Noted  . ALLERGIC RHINITIS DUE TO POLLEN 01/05/2009  . CHICKENPOX, HX OF 01/05/2009    History reviewed. No pertinent surgical history.  Prior to Admission medications   Medication Sig Start Date End Date Taking? Authorizing Provider  famotidine (PEPCID) 40 MG tablet Take 1 tablet (40 mg total) by mouth every evening. 04/03/17 04/03/18  Loney Hering, MD  metoCLOPramide (REGLAN) 10 MG tablet Take 1 tablet (10 mg total) by mouth every 8 (eight) hours as needed. 04/03/17   Loney Hering, MD  omeprazole (PRILOSEC) 20 MG capsule Take 1 capsule (20 mg total) by mouth daily. Patient not taking: Reported on 04/02/2017 04/20/15   Gregor Hams, MD  ondansetron (ZOFRAN ODT) 4 MG disintegrating tablet Take 1 tablet (4 mg total) by mouth every 8 (eight) hours as needed  for nausea or vomiting. 04/02/17   Rudene Re, MD  ondansetron (ZOFRAN) 4 MG tablet Take 1 tablet (4 mg total) by mouth every 8 (eight) hours as needed for nausea or vomiting. Patient not taking: Reported on 04/02/2017 03/04/15   Lisa Roca, MD  sucralfate (CARAFATE) 1 g tablet Take 1 g by mouth daily as needed. 10/19/16 10/19/17  [provider]  sucralfate (CARAFATE) 1 GM/10ML suspension Take 10 mLs (1 g total) by mouth 4 (four) times daily. 04/03/17 04/03/18  Loney Hering, MD    No Known Allergies  No family history on file.  Social History Social History   Tobacco Use  . Smoking status: Current Some Day Smoker  . Smokeless tobacco: Never Used  Substance Use Topics  . Alcohol use: Yes  . Drug use: No    Review of Systems Constitutional: Negative for fever. Cardiovascular: Negative for chest pain. Respiratory: Negative for shortness of breath. Gastrointestinal: Intermittent periumbilical abdominal pain.  Positive for nausea vomiting this morning.  Negative for diarrhea.  Constipation x1 day. Genitourinary: Negative for urinary compaints Musculoskeletal: Negative for musculoskeletal complaints Skin: Negative for skin complaints  Neurological: Negative for headache All other ROS negative  ____________________________________________   PHYSICAL EXAM:  VITAL SIGNS: ED Triage Vitals  Enc Vitals Group     BP 02/10/18 1221 124/89     Pulse Rate 02/10/18 1221 80     Resp 02/10/18 1221 18     Temp 02/10/18 1221 98.2  F (36.8 C)     Temp Source 02/10/18 1221 Oral     SpO2 02/10/18 1221 99 %     Weight 02/10/18 1222 180 lb (81.6 kg)     Height 02/10/18 1222 5\' 10"  (1.778 m)     Head Circumference --      Peak Flow --      Pain Score 02/10/18 1226 5     Pain Loc --      Pain Edu? --      Excl. in [place]? --     Constitutional: Alert and oriented. Well appearing and in no distress. Eyes: Normal exam ENT   Head: Normocephalic and atraumatic.    Mouth/Throat: Mucous membranes are moist. Cardiovascular: Normal rate, regular rhythm. No murmur Respiratory: Normal respiratory effort without tachypnea nor retractions. Breath sounds are clear Gastrointestinal: Soft and nontender. No distention. Musculoskeletal: Nontender with normal range of motion in all extremities.  Neurologic:  Normal speech and language. No gross focal neurologic deficits  Skin:  Skin is warm, dry and intact.  Psychiatric: Mood and affect are normal.  ____________________________________________   RADIOLOGY  X-ray negative  ____________________________________________   INITIAL IMPRESSION / ASSESSMENT AND PLAN / ED COURSE  Pertinent labs & imaging results that were available during my care of the patient were reviewed by me and considered in my medical decision making (see chart for details).  Patient presents the emergency department for intermittent central abdominal pain that is coming and going since this morning.  She also states 1 day of constipation.  States nausea with several episodes of vomiting this morning.  Currently the patient appears very well denies any pain at this time but states the pain will come and go.  Completely benign abdominal exam, no tenderness to palpation.  Patient's labs are largely within normal limits besides blood in her urinalysis, no bacteria or white blood cells and the patient is currently on her menstrual cycle.  Given the patient's complaint of constipation today with intermittent abdominal pain highly suspect intestinal type pain.  Will obtain a two-view abdominal x-ray to evaluate.  We will also treat with Zofran and Bentyl.  Patient agreeable plan of care.  Patient is feeling better after medications.  Denies any pain at this time but states she did have an episode approximately 10 to 50 minutes ago of sharp pain lasted several seconds and then went away.  Highly suspect intestinal type pain.  X-ray largely negative.  We  will place the patient on Bentyl, Zofran if needed as well as MiraLAX.  Patient agreeable to plan of care.  Discussed my normal abdominal pain return precautions.  ____________________________________________   FINAL CLINICAL IMPRESSION(S) / ED DIAGNOSES  Intermittent abdominal pain Nausea vomiting    Harvest Dark, MD 02/10/18 [phone]

## 2018-02-10 NOTE — ED Notes (Signed)
Patient transported to X-ray 

## 2018-02-10 NOTE — ED Triage Notes (Signed)
Pt to ED via POV c/o periumbilical abdominal pain since this morning as well as N/V. Pt states that she has vomited "a lot". Pt denies diarrhea or fevers. Pt states that she has been unable to have a bowel movement today. Pt is in NAD at this time.

## 2018-02-22 DIAGNOSIS — Z0000 Encounter for general adult medical examination without abnormal findings: Secondary | ICD-10-CM | POA: Diagnosis not present

## 2018-02-22 DIAGNOSIS — Z111 Encounter for screening for respiratory tuberculosis: Secondary | ICD-10-CM | POA: Diagnosis not present

## 2018-03-12 DIAGNOSIS — K2950 Unspecified chronic gastritis without bleeding: Secondary | ICD-10-CM | POA: Diagnosis not present

## 2018-07-01 DIAGNOSIS — H5213 Myopia, bilateral: Secondary | ICD-10-CM | POA: Diagnosis not present

## 2018-07-18 ENCOUNTER — Other Ambulatory Visit: Payer: Self-pay

## 2018-07-18 ENCOUNTER — Encounter: Payer: Self-pay | Admitting: Emergency Medicine

## 2018-07-18 ENCOUNTER — Emergency Department: Payer: 59

## 2018-07-18 ENCOUNTER — Emergency Department
Admission: EM | Admit: 2018-07-18 | Discharge: 2018-07-18 | Disposition: A | Discharge status: Home / Self Care | Payer: 59 | Attending: Emergency Medicine | Admitting: Emergency Medicine

## 2018-07-18 DIAGNOSIS — F17200 Nicotine dependence, unspecified, uncomplicated: Secondary | ICD-10-CM | POA: Diagnosis not present

## 2018-07-18 DIAGNOSIS — Z79899 Other long term (current) drug therapy: Secondary | ICD-10-CM | POA: Diagnosis not present

## 2018-07-18 DIAGNOSIS — R6884 Jaw pain: Secondary | ICD-10-CM | POA: Diagnosis present

## 2018-07-18 DIAGNOSIS — K047 Periapical abscess without sinus: Secondary | ICD-10-CM | POA: Diagnosis not present

## 2018-07-18 DIAGNOSIS — K011 Impacted teeth: Secondary | ICD-10-CM | POA: Diagnosis not present

## 2018-07-18 DIAGNOSIS — K122 Cellulitis and abscess of mouth: Secondary | ICD-10-CM | POA: Diagnosis not present

## 2018-07-18 LAB — CBC WITH DIFFERENTIAL/PLATELET
Abs Immature Granulocytes: 0.04 10*3/uL (ref 0.00–0.07)
BASOS PCT: 0 %
Basophils Absolute: 0 10*3/uL (ref 0.0–0.1)
Eosinophils Absolute: 0.1 10*3/uL (ref 0.0–0.5)
Eosinophils Relative: 1 %
HCT: 38.4 % (ref 36.0–46.0)
Hemoglobin: 13 g/dL (ref 12.0–15.0)
IMMATURE GRANULOCYTES: 0 %
Lymphocytes Relative: 17 %
Lymphs Abs: 1.8 10*3/uL (ref 0.7–4.0)
MCH: 30.2 pg (ref 26.0–34.0)
MCHC: 33.9 g/dL (ref 30.0–36.0)
MCV: 89.3 fL (ref 80.0–100.0)
Monocytes Absolute: 1.1 10*3/uL — ABNORMAL HIGH (ref 0.1–1.0)
Monocytes Relative: 10 %
NEUTROS ABS: 7.6 10*3/uL (ref 1.7–7.7)
NRBC: 0 % (ref 0.0–0.2)
Neutrophils Relative %: 72 %
PLATELETS: 253 10*3/uL (ref 150–400)
RBC: 4.3 MIL/uL (ref 3.87–5.11)
RDW: 13.2 % (ref 11.5–15.5)
WBC: 10.7 10*3/uL — ABNORMAL HIGH (ref 4.0–10.5)

## 2018-07-18 LAB — COMPREHENSIVE METABOLIC PANEL
ALT: 11 U/L (ref 0–44)
AST: 19 U/L (ref 15–41)
Albumin: 4.3 g/dL (ref 3.5–5.0)
Alkaline Phosphatase: 55 U/L (ref 38–126)
Anion gap: 7 (ref 5–15)
BUN: 7 mg/dL (ref 6–20)
CALCIUM: 8.8 mg/dL — AB (ref 8.9–10.3)
CHLORIDE: 111 mmol/L (ref 98–111)
CO2: 20 mmol/L — ABNORMAL LOW (ref 22–32)
Creatinine, Ser: 0.69 mg/dL (ref 0.44–1.00)
Glucose, Bld: 105 mg/dL — ABNORMAL HIGH (ref 70–99)
Potassium: 3.3 mmol/L — ABNORMAL LOW (ref 3.5–5.1)
Sodium: 138 mmol/L (ref 135–145)
Total Bilirubin: 0.5 mg/dL (ref 0.3–1.2)
Total Protein: 7.2 g/dL (ref 6.5–8.1)

## 2018-07-18 LAB — I-STAT BETA HCG BLOOD, ED (NOT ORDERABLE)

## 2018-07-18 MED ORDER — HYDROCODONE-ACETAMINOPHEN 7.5-325 MG/15ML PO SOLN: 15.0000 mL | Freq: Four times a day (QID) | ORAL | 0 refills | Status: AC | PRN

## 2018-07-18 MED ORDER — AMOXICILLIN-POT CLAVULANATE 250-62.5 MG/5ML PO SUSR: 875.0000 mg | Freq: Two times a day (BID) | ORAL | 0 refills | Status: AC

## 2018-07-18 MED ORDER — IOHEXOL 300 MG/ML  SOLN
75.0000 mL | Freq: Once | INTRAMUSCULAR | Status: AC | PRN
  Administered 2018-07-18: 75 mL via INTRAVENOUS

## 2018-07-18 MED ORDER — FENTANYL CITRATE (PF) 100 MCG/2ML IJ SOLN
75.0000 ug | Freq: Once | INTRAMUSCULAR | Status: AC
  Administered 2018-07-18: 75 ug via INTRAVENOUS
  Filled 2018-07-18: qty 2

## 2018-07-18 MED ORDER — SODIUM CHLORIDE 0.9 % IV SOLN
3.0000 g | Freq: Once | INTRAVENOUS | Status: AC
  Administered 2018-07-18: 3 g via INTRAVENOUS
  Filled 2018-07-18: qty 3

## 2018-07-18 MED ORDER — CHLORHEXIDINE GLUCONATE 0.12 % MT SOLN: 15.0000 mL | Freq: Two times a day (BID) | OROMUCOSAL | 0 refills | Status: DC

## 2018-07-18 MED ORDER — KETOROLAC TROMETHAMINE 30 MG/ML IJ SOLN
15.0000 mg | Freq: Once | INTRAMUSCULAR | Status: AC
  Administered 2018-07-18: 15 mg via INTRAVENOUS
  Filled 2018-07-18: qty 1

## 2018-07-18 MED ORDER — AMOXICILLIN-POT CLAVULANATE 250-62.5 MG/5ML PO SUSR: 875.0000 mg | Freq: Two times a day (BID) | ORAL | 0 refills | Status: DC

## 2018-07-18 MED ORDER — ONDANSETRON HCL 4 MG/2ML IJ SOLN
4.0000 mg | Freq: Once | INTRAMUSCULAR | Status: AC
  Administered 2018-07-18: 4 mg via INTRAVENOUS
  Filled 2018-07-18: qty 2

## 2018-07-18 NOTE — Discharge Instructions (Addendum)
Please take your antibiotics twice a day as prescribed and use your prescription pain medication only as needed for severe symptoms.  600 mg of liquid ibuprofen 3 times a day is extremely effective as well.  Please use your prescription mouthwash 3-4 times a day to help keep your mouth clean and follow-up with your oral surgeon within 2 days for recheck.  Return to the emergency department sooner for any new or worsening symptoms such as fevers, chills, worsening pain, drooling shortness of breath, or for any other issues whatsoever.  It was a pleasure to take care of you today, and thank you for coming to our emergency department.  If you have any questions or concerns before leaving please ask the nurse to grab me and I'm more than happy to go through your aftercare instructions again.  If you were prescribed any opioid pain medication today such as Norco, Vicodin, Percocet, morphine, hydrocodone, or oxycodone please make sure you do not drive when you are taking this medication as it can alter your ability to drive safely.  If you have any concerns once you are home that you are not improving or are in fact getting worse before you can make it to your follow-up appointment, please do not hesitate to call 911 and come back for further evaluation.  Darel Hong, MD  Results for orders placed or performed during the hospital encounter of 07/18/18  Comprehensive metabolic panel  Result Value Ref Range   Sodium 138 135 - 145 mmol/L   Potassium 3.3 (L) 3.5 - 5.1 mmol/L   Chloride 111 98 - 111 mmol/L   CO2 20 (L) 22 - 32 mmol/L   Glucose, Bld 105 (H) 70 - 99 mg/dL   BUN 7 6 - 20 mg/dL   Creatinine, Ser 0.69 0.44 - 1.00 mg/dL   Calcium 8.8 (L) 8.9 - 10.3 mg/dL   Total Protein 7.2 6.5 - 8.1 g/dL   Albumin 4.3 3.5 - 5.0 g/dL   AST 19 15 - 41 U/L   ALT 11 0 - 44 U/L   Alkaline Phosphatase 55 38 - 126 U/L   Total Bilirubin 0.5 0.3 - 1.2 mg/dL   GFR calc non Af Amer >60 >60 mL/min   GFR calc Af  Amer >60 >60 mL/min   Anion gap 7 5 - 15  CBC with Differential  Result Value Ref Range   WBC 10.7 (H) 4.0 - 10.5 K/uL   RBC 4.30 3.87 - 5.11 MIL/uL   Hemoglobin 13.0 12.0 - 15.0 g/dL   HCT 38.4 36.0 - 46.0 %   MCV 89.3 80.0 - 100.0 fL   MCH 30.2 26.0 - 34.0 pg   MCHC 33.9 30.0 - 36.0 g/dL   RDW 13.2 11.5 - 15.5 %   Platelets 253 150 - 400 K/uL   nRBC 0.0 0.0 - 0.2 %   Neutrophils Relative % 72 %   Neutro Abs 7.6 1.7 - 7.7 K/uL   Lymphocytes Relative 17 %   Lymphs Abs 1.8 0.7 - 4.0 K/uL   Monocytes Relative 10 %   Monocytes Absolute 1.1 (H) 0.1 - 1.0 K/uL   Eosinophils Relative 1 %   Eosinophils Absolute 0.1 0.0 - 0.5 K/uL   Basophils Relative 0 %   Basophils Absolute 0.0 0.0 - 0.1 K/uL   Immature Granulocytes 0 %   Abs Immature Granulocytes 0.04 0.00 - 0.07 K/uL  I-Stat beta hCG blood, ED  Result Value Ref Range   I-stat hCG, quantitative <5.0 <  5 mIU/mL   Comment 3           Ct Soft Tissue Neck W Contrast  Result Date: 07/18/2018 CLINICAL DATA:  2-3 days trismus after recent wisdom tooth extraction. EXAM: CT NECK WITH CONTRAST TECHNIQUE: Multidetector CT imaging of the neck was performed using the standard protocol following the bolus administration of intravenous contrast. CONTRAST:  44mL OMNIPAQUE IOHEXOL 300 MG/ML  SOLN COMPARISON:  None. FINDINGS: Pharynx and larynx: No evident mass, inflammation, or airway narrowing Salivary glands: No inflammation, mass, or stone. Thyroid: Normal. Lymph nodes: Mild reactive appearing enlargement of upper cervical lymph nodes, without cavitation Vascular: Negative. Limited intracranial: Negative. Visualized orbits: Negative. Mastoids and visualized paranasal sinuses: Retention cysts in the floor of the right maxillary sinus. Skeleton: Recent mandibular wisdom tooth extraction. On the left there is an adjacent rim enhancing collection within the thickened pterygoids measuring up to 13 x 17 mm. This is more likely an abscess than organized  hematoma given the acuity of symptoms and mild leukocytosis. At least 2 small bone fragments are present at the periphery of this collection. The collection tracks into the empty alveolus on sagittal images. Upper chest: Negative IMPRESSION: 17 x13 mm presumed abscess within the left pterygoids adjacent to the recently extracted left mandibular wisdom tooth. Small bone fragments are present at the periphery of the collection. Electronically Signed   By: Monte Fantasia M.D.   On: 07/18/2018 06:15

## 2018-07-18 NOTE — ED Triage Notes (Signed)
Patient ambulatory to triage with steady gait, without difficulty or distress noted; pt reports that she is having "difficulty opening her mouth, like lockjaw"; st "difficulty swallowing and ear pain" x 2-3 days

## 2018-07-18 NOTE — ED Provider Notes (Signed)
[facility] Emergency Department Provider Note  ____________________________________________   First MD Initiated Contact with Patient 07/18/18 [phone]     (approximate)  I have reviewed the triage vital signs and the nursing notes.   HISTORY  Chief Complaint Jaw Pain   HPI Rose Campos is a 28 y.o. female who self presents to the emergency department with 2 to 3 days of difficulty opening her mouth and inability to eat.  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  She was healing normally until about 3 days ago at which point she felt it was nearly impossible to open her mouth.  She denies fevers or chills.  She denies drooling.  She does think that her voice sounds "strange".  She has been eating oatmeal but has been unable to open her mouth for any other food.  Her pain is now moderate to severe aching constant and worse on the left side of her face.  The right side is unaffected.  Port pain in her left ear.    Past Medical History:  Diagnosis Date  . Gastritis     Patient Active Problem List   Diagnosis Date Noted  . ALLERGIC RHINITIS DUE TO POLLEN 01/05/2009  . CHICKENPOX, HX OF 01/05/2009    History reviewed. No pertinent surgical history.  Prior to Admission medications   Medication Sig Start Date End Date Taking? Authorizing Provider  amoxicillin-clavulanate (AUGMENTIN) 250-62.5 MG/5ML suspension Take 17.5 mLs (875 mg total) by mouth 2 (two) times daily for 10 days. 07/18/18 07/28/18  Darel Hong, MD  chlorhexidine (PERIDEX) 0.12 % solution Use as directed 15 mLs in the mouth or throat 2 (two) times daily. 07/18/18   Darel Hong, MD  dicyclomine (BENTYL) 20 MG tablet Take 1 tablet (20 mg total) by mouth 3 (three) times daily as needed for spasms. 02/10/18 02/10/19  Harvest Dark, MD    famotidine (PEPCID) 40 MG tablet Take 1 tablet (40 mg total) by mouth every evening. 04/03/17 04/03/18  Loney Hering, MD  HYDROcodone-acetaminophen (HYCET) 7.5-325 mg/15 ml solution Take 15 mLs by mouth 4 (four) times daily as needed for moderate pain. 07/18/18 07/18/19  Darel Hong, MD  metoCLOPramide (REGLAN) 10 MG tablet Take 1 tablet (10 mg total) by mouth every 8 (eight) hours as needed. 04/03/17   Loney Hering, MD  omeprazole (PRILOSEC) 20 MG capsule Take 1 capsule (20 mg total) by mouth daily. Patient not taking: Reported on 04/02/2017 04/20/15   Gregor Hams, MD  ondansetron (ZOFRAN ODT) 4 MG disintegrating tablet Take 1 tablet (4 mg total) by mouth every 8 (eight) hours as needed for nausea or vomiting. 02/10/18   Harvest Dark, MD  ondansetron (ZOFRAN) 4 MG tablet Take 1 tablet (4 mg total) by mouth every 8 (eight) hours as needed for nausea or vomiting. Patient not taking: Reported on 04/02/2017 03/04/15   Lisa Roca, MD  polyethylene glycol powder (GLYCOLAX/MIRALAX) powder Take 17 g by mouth daily as needed for moderate constipation. 02/10/18   Harvest Dark, MD  sucralfate (CARAFATE) 1 g tablet Take 1 g by mouth daily as needed. 10/19/16 10/19/17  [provider]  sucralfate (CARAFATE) 1 GM/10ML suspension Take 10 mLs (1 g total) by mouth 4 (four) times daily. 04/03/17 04/03/18  Loney Hering, MD    Allergies Patient has no  known allergies.  No family history on file.  Social History Social History   Tobacco Use  . Smoking status: Current Some Day Smoker  . Smokeless tobacco: Never Used  Substance Use Topics  . Alcohol use: Yes  . Drug use: No    Review of Systems Constitutional: No fever/chills Eyes: No visual changes. ENT: Positive for jaw pain and left ear pain Cardiovascular: Denies chest pain. Respiratory: Denies shortness of breath. Gastrointestinal: No abdominal pain.  No nausea, no vomiting.  No diarrhea.  No  constipation. Genitourinary: Negative for dysuria. Musculoskeletal: Negative for back pain. Skin: Negative for rash. Neurological: Negative for headaches, focal weakness or numbness.   ____________________________________________   PHYSICAL EXAM:  VITAL SIGNS: ED Triage Vitals [07/18/18 0425]  Enc Vitals Group     BP      Pulse      Resp      Temp      Temp src      SpO2      Weight 170 lb (77.1 kg)     Height 5\' 10"  (1.778 m)     Head Circumference      Peak Flow      Pain Score 8     Pain Loc      Pain Edu?      Excl. in Poth?     Constitutional: Alert and oriented x4 tearful and appears very uncomfortable Eyes: PERRL EOMI. Head: Atraumatic.  Normal tympanic membranes bilaterally.  Quite tender over left TMJ Nose: No congestion/rhinnorhea. Mouth/Throat: Positive for trismus.  Uvula midline no pharyngeal erythema or exudate.  Some lymphadenopathy in her left anterior neck. Neck: No stridor.   Cardiovascular: Normal rate, regular rhythm. Grossly normal heart sounds.  Good peripheral circulation. Respiratory: Normal respiratory effort.  No retractions. Lungs CTAB and moving good air Gastrointestinal: Soft nontender Musculoskeletal: No lower extremity edema   Neurologic:  Normal speech and language. No gross focal neurologic deficits are appreciated. Skin:  Skin is warm, dry and intact. No rash noted. Psychiatric: Mood and affect are normal. Speech and behavior are normal.    ____________________________________________   DIFFERENTIAL includes but not limited to  TMJ, retropharyngeal abscess, submandibular abscess, jaw dislocation ____________________________________________   LABS (all labs ordered are listed, but only abnormal results are displayed)  Labs Reviewed  COMPREHENSIVE METABOLIC PANEL - Abnormal; Notable for the following components:      Result Value   Potassium 3.3 (*)    CO2 20 (*)    Glucose, Bld 105 (*)    Calcium 8.8 (*)    All other  components within normal limits  CBC WITH DIFFERENTIAL/PLATELET - Abnormal; Notable for the following components:   WBC 10.7 (*)    Monocytes Absolute 1.1 (*)    All other components within normal limits  I-STAT BETA HCG BLOOD, ED (NOT ORDERABLE)    Lab work reviewed by me with slightly low CO2 consistent with acidemia __________________________________________  EKG   ____________________________________________  RADIOLOGY  CT scan soft tissue neck reviewed by me shows a small left-sided dental abscess ____________________________________________   PROCEDURES  Procedure(s) performed: no  Procedures  Critical Care performed: no  ____________________________________________   INITIAL IMPRESSION / ASSESSMENT AND PLAN / ED COURSE  Pertinent labs & imaging results that were available during my care of the patient were reviewed by me and considered in my medical decision making (see chart for details).   As part of my medical decision making, I reviewed the following data within the  electronic MEDICAL RECORD NUMBER History obtained from family if available, nursing notes, old chart and ekg, as well as notes from prior ED visits.  Patient comes to the emergency department with 2 to 3 days of trismus and left-sided jaw pain.  This is in the setting of having wisdom teeth removed 1 month ago.  She has no drooling or shortness of breath.  Initially given 75 mcg of fentanyl along with 15 mg of Toradol and 4 mg of Zofran for pain and nausea with improvement in her symptoms.  Given the new trismus I did perform a CT scan to evaluate for posterior infection and she has a posterior dental abscess although as she is protecting her airway she does not require emergent drainage.  I have given her a first dose of IV Unasyn here and will prescribe liquid Augmentin for home along with liquid hydrocodone and Peridex and refer her back to her oral surgeon.  We did discuss strict return precautions.       ____________________________________________   FINAL CLINICAL IMPRESSION(S) / ED DIAGNOSES  Final diagnoses:  Dental abscess      NEW MEDICATIONS STARTED DURING THIS VISIT:  Discharge Medication List as of 07/18/2018  6:27 AM    START taking these medications   Details  chlorhexidine (PERIDEX) 0.12 % solution Use as directed 15 mLs in the mouth or throat 2 (two) times daily., Starting Thu 07/18/2018, Print    HYDROcodone-acetaminophen (HYCET) 7.5-325 mg/15 ml solution Take 15 mLs by mouth 4 (four) times daily as needed for moderate pain., Starting Thu 07/18/2018, Until Fri 07/18/2019, Print    amoxicillin-clavulanate (AUGMENTIN) 250-62.5 MG/5ML suspension Take 17.5 mLs (875 mg total) by mouth 2 (two) times daily for 10 days., Starting Thu 07/18/2018, Until Sun 07/28/2018, Normal         Note:  This document was prepared using Dragon voice recognition software and may include unintentional dictation errors.    Darel Hong, MD 07/20/18 2219

## 2018-09-09 ENCOUNTER — Encounter: Payer: Self-pay | Admitting: Obstetrics & Gynecology

## 2018-09-09 ENCOUNTER — Ambulatory Visit (INDEPENDENT_AMBULATORY_CARE_PROVIDER_SITE_OTHER): Payer: 59 | Admitting: Obstetrics & Gynecology

## 2018-09-09 ENCOUNTER — Other Ambulatory Visit (HOSPITAL_COMMUNITY)
Admission: RE | Admit: 2018-09-09 | Discharge: 2018-09-09 | Disposition: A | Discharge status: Home / Self Care | Payer: 59 | Source: Ambulatory Visit | Attending: Obstetrics & Gynecology | Admitting: Obstetrics & Gynecology

## 2018-09-09 VITALS — BP 110/60 | Ht 70.0 in | Wt 185.0 lb

## 2018-09-09 DIAGNOSIS — N921 Excessive and frequent menstruation with irregular cycle: Secondary | ICD-10-CM

## 2018-09-09 DIAGNOSIS — Z124 Encounter for screening for malignant neoplasm of cervix: Secondary | ICD-10-CM | POA: Diagnosis not present

## 2018-09-09 NOTE — Progress Notes (Signed)
Dysfunctional Uterine Bleeding Patient complains of irregular menses for the last year. She had been bleeding irregularly. She is now bleeding every 40-45 days and menses are lasting several days. She changes her pad or tampon every 1 hours. Clots are medium in size.  Heavy uncomfortable periods Dysmenorrhea:moderate, occurring throughout menses. Cyclic symptoms include: none. Current contraception: none and she hasnot been on hormones for many years. In fact has wanted to conceive for many years w same partner since age 32, butnever has.. History of infertility: yes - no prior evaluation, just active without success. History of abnormal Pap smear: no.   PMHx: She  has a past medical history of Gastritis. Also,  has no past surgical history on file., family history includes Breast cancer in her maternal grandmother.,  reports that she has been smoking. She has never used smokeless tobacco. She reports current alcohol use. She reports that she does not use drugs.  She has a current medication list which includes the following prescription(s): chlorhexidine, dicyclomine, famotidine, hydrocodone-acetaminophen, metoclopramide, omeprazole, ondansetron, ondansetron, polyethylene glycol powder, sucralfate, and sucralfate. Also, is allergic to nickel.  Review of Systems  Constitutional: Negative for chills, fever and malaise/fatigue.  HENT: Negative for congestion, sinus pain and sore throat.   Eyes: Negative for blurred vision and pain.  Respiratory: Negative for cough and wheezing.   Cardiovascular: Negative for chest pain and leg swelling.  Gastrointestinal: Negative for abdominal pain, constipation, diarrhea, heartburn, nausea and vomiting.  Genitourinary: Negative for dysuria, frequency, hematuria and urgency.  Musculoskeletal: Negative for back pain, joint pain, myalgias and neck pain.  Skin: Negative for itching and rash.  Neurological: Negative for dizziness, tremors and weakness.    Endo/Heme/Allergies: Does not bruise/bleed easily.  Psychiatric/Behavioral: Negative for depression. The patient is not nervous/anxious and does not have insomnia.     Objective: BP 110/60   Ht 5\' 10"  (1.778 m)   Wt 185 lb (83.9 kg)   LMP 08/29/2018   BMI 26.54 kg/m  Physical Exam Constitutional:      General: She is not in acute distress.    Appearance: She is well-developed.  Genitourinary:     Pelvic exam was performed with patient supine.     Vagina, uterus and rectum normal.     No lesions in the vagina.     No vaginal bleeding.     No cervical motion tenderness, friability, lesion or polyp.     Uterus is mobile.     Uterus is not enlarged.     No uterine mass detected.    Uterus is midaxial.     No right or left adnexal mass present.     Right adnexa not tender.     Left adnexa not tender.  HENT:     Head: Normocephalic and atraumatic. No laceration.     Right Ear: Hearing normal.     Left Ear: Hearing normal.     Mouth/Throat:     Pharynx: Uvula midline.  Eyes:     Pupils: Pupils are equal, round, and reactive to light.  Neck:     Musculoskeletal: Normal range of motion and neck supple.     Thyroid: No thyromegaly.  Cardiovascular:     Rate and Rhythm: Normal rate and regular rhythm.     Heart sounds: No murmur. No friction rub. No gallop.   Pulmonary:     Effort: Pulmonary effort is normal. No respiratory distress.     Breath sounds: Normal breath sounds. No wheezing.  Chest:  Breasts:        Right: No mass, skin change or tenderness.        Left: No mass, skin change or tenderness.  Abdominal:     General: Bowel sounds are normal. There is no distension.     Palpations: Abdomen is soft.     Tenderness: There is no abdominal tenderness. There is no rebound.  Musculoskeletal: Normal range of motion.  Neurological:     Mental Status: She is alert and oriented to person, place, and time.     Cranial Nerves: No cranial nerve deficit.  Skin:     General: Skin is warm and dry.  Psychiatric:        Judgment: Judgment normal.  Vitals signs reviewed.   ASSESSMENT/PLAN:    Menorrhagia with irregular cycle    -  Primary   Relevant Orders   FSH/LH   Estradiol   Testosterone,Free and Total   US PELVIS TRANSVANGINAL NON-OB (TV ONLY)   Screening for cervical cancer       Relevant Orders   Cytology - PAP    Infertility also discussed    May need SA, HSG  Options for period control discussed, usually birth control.  Pros and cons discussed. If desires more aggressive attempts for pregnancy, will consider Metformin, Clomid after testing.  [place] to assess anatomical concerns  Barnett Applebaum, MD, [facility], [facility] 09/09/2018  11:27 AM

## 2018-09-10 LAB — CYTOLOGY - PAP: DIAGNOSIS: NEGATIVE

## 2018-09-10 LAB — TESTOSTERONE,FREE AND TOTAL
TESTOSTERONE FREE: 2.5 pg/mL (ref 0.0–4.2)
Testosterone: 28 ng/dL (ref 8–48)

## 2018-09-10 LAB — FSH/LH
FSH: 6.8 m[IU]/mL
LH: 11.9 m[IU]/mL

## 2018-09-10 LAB — ESTRADIOL: Estradiol: 56.8 pg/mL

## 2018-09-13 ENCOUNTER — Other Ambulatory Visit: Payer: Self-pay

## 2018-09-13 ENCOUNTER — Ambulatory Visit
Admission: RE | Admit: 2018-09-13 | Discharge: 2018-09-13 | Disposition: A | Discharge status: Home / Self Care | Payer: 59 | Source: Ambulatory Visit | Attending: Obstetrics & Gynecology | Admitting: Obstetrics & Gynecology

## 2018-09-13 DIAGNOSIS — N921 Excessive and frequent menstruation with irregular cycle: Secondary | ICD-10-CM | POA: Diagnosis not present

## 2018-09-13 DIAGNOSIS — N920 Excessive and frequent menstruation with regular cycle: Secondary | ICD-10-CM | POA: Diagnosis not present

## 2018-10-15 ENCOUNTER — Telehealth: Payer: Self-pay | Admitting: Obstetrics & Gynecology

## 2018-10-15 NOTE — Telephone Encounter (Signed)
Pt calling about the U/S that she had done on 09/13/18 at the hospital and would like to know the results. Would like a call back.   Cb# [phone]

## 2018-10-16 NOTE — Telephone Encounter (Signed)
Called pt no answer and vmb not set up

## 2018-10-16 NOTE — Telephone Encounter (Signed)
Let her know ultrasound was normal, as were her labs. Further testing would include semen analysis of partner and HSG (xray) of tubes which cannot be done until the restriction lift related to [place]. Continue to diary/track her periods and try for pregnancy as desired.

## 2018-10-21 DIAGNOSIS — J3089 Other allergic rhinitis: Secondary | ICD-10-CM | POA: Diagnosis not present

## 2018-10-21 DIAGNOSIS — J069 Acute upper respiratory infection, unspecified: Secondary | ICD-10-CM | POA: Diagnosis not present

## 2018-10-31 DIAGNOSIS — J3089 Other allergic rhinitis: Secondary | ICD-10-CM | POA: Diagnosis not present

## 2018-10-31 DIAGNOSIS — B373 Candidiasis of vulva and vagina: Secondary | ICD-10-CM | POA: Diagnosis not present

## 2018-10-31 DIAGNOSIS — J312 Chronic pharyngitis: Secondary | ICD-10-CM | POA: Diagnosis not present

## 2019-01-02 DIAGNOSIS — K449 Diaphragmatic hernia without obstruction or gangrene: Secondary | ICD-10-CM | POA: Diagnosis not present

## 2019-01-02 DIAGNOSIS — B009 Herpesviral infection, unspecified: Secondary | ICD-10-CM | POA: Diagnosis not present

## 2019-01-02 DIAGNOSIS — F4010 Social phobia, unspecified: Secondary | ICD-10-CM | POA: Diagnosis not present

## 2019-01-02 DIAGNOSIS — L230 Allergic contact dermatitis due to metals: Secondary | ICD-10-CM | POA: Diagnosis not present

## 2019-02-10 DIAGNOSIS — M2012 Hallux valgus (acquired), left foot: Secondary | ICD-10-CM | POA: Diagnosis not present

## 2019-02-10 DIAGNOSIS — M76822 Posterior tibial tendinitis, left leg: Secondary | ICD-10-CM | POA: Diagnosis not present

## 2019-02-10 DIAGNOSIS — M79672 Pain in left foot: Secondary | ICD-10-CM | POA: Diagnosis not present

## 2019-07-03 ENCOUNTER — Ambulatory Visit (LOCAL_COMMUNITY_HEALTH_CENTER): Payer: Self-pay

## 2019-07-03 ENCOUNTER — Other Ambulatory Visit: Payer: Self-pay

## 2019-07-03 DIAGNOSIS — Z23 Encounter for immunization: Secondary | ICD-10-CM

## 2019-07-11 DIAGNOSIS — Z20828 Contact with and (suspected) exposure to other viral communicable diseases: Secondary | ICD-10-CM | POA: Diagnosis not present

## 2020-02-02 ENCOUNTER — Ambulatory Visit
Admission: RE | Admit: 2020-02-02 | Discharge: 2020-02-02 | Disposition: A | Discharge status: Home / Self Care | Payer: PRIVATE HEALTH INSURANCE | Source: Home / Self Care | Attending: *Deleted | Admitting: *Deleted

## 2020-02-02 ENCOUNTER — Ambulatory Visit
Admission: RE | Admit: 2020-02-02 | Discharge: 2020-02-02 | Disposition: A | Discharge status: Home / Self Care | Payer: PRIVATE HEALTH INSURANCE | Source: Ambulatory Visit | Attending: Plastic Surgery | Admitting: Plastic Surgery

## 2020-02-02 ENCOUNTER — Ambulatory Visit
Admission: RE | Admit: 2020-02-02 | Discharge: 2020-02-02 | Disposition: A | Discharge status: Home / Self Care | Payer: PRIVATE HEALTH INSURANCE | Attending: Plastic Surgery | Admitting: Plastic Surgery

## 2020-02-02 ENCOUNTER — Other Ambulatory Visit: Payer: Self-pay | Admitting: Plastic Surgery

## 2020-02-02 DIAGNOSIS — Z01818 Encounter for other preprocedural examination: Secondary | ICD-10-CM | POA: Insufficient documentation

## 2020-02-02 DIAGNOSIS — Z01811 Encounter for preprocedural respiratory examination: Secondary | ICD-10-CM

## 2020-12-24 ENCOUNTER — Emergency Department
Admission: EM | Admit: 2020-12-24 | Discharge: 2020-12-25 | Disposition: A | Discharge status: Home / Self Care | Payer: No Typology Code available for payment source | Attending: Emergency Medicine | Admitting: Emergency Medicine

## 2020-12-24 ENCOUNTER — Other Ambulatory Visit: Payer: Self-pay

## 2020-12-24 DIAGNOSIS — F17200 Nicotine dependence, unspecified, uncomplicated: Secondary | ICD-10-CM | POA: Insufficient documentation

## 2020-12-24 DIAGNOSIS — R2240 Localized swelling, mass and lump, unspecified lower limb: Secondary | ICD-10-CM

## 2020-12-24 DIAGNOSIS — M25552 Pain in left hip: Secondary | ICD-10-CM | POA: Insufficient documentation

## 2020-12-24 DIAGNOSIS — R2242 Localized swelling, mass and lump, left lower limb: Secondary | ICD-10-CM | POA: Diagnosis not present

## 2020-12-24 NOTE — ED Triage Notes (Signed)
Pt states she noticed lump on left thigh 3 weeks ago, pt states lump was initially painful with pressure. Pt was seen at [facility] and by PCP and told to follow up with imaging, pt states that they cant get a scan till the end of august and she is concerned to wait that long.

## 2020-12-25 ENCOUNTER — Emergency Department: Payer: No Typology Code available for payment source

## 2020-12-25 NOTE — ED Provider Notes (Signed)
[facility] Emergency Department Provider Note  ____________________________________________   Event Date/Time   First MD Initiated Contact with Patient 12/25/20 0000     (approximate)  I have reviewed the triage vital signs and the nursing notes.   HISTORY  Chief Complaint No chief complaint on file.    HPI Rose Campos is a 30 y.o. female who presents for evaluation of 2 lumps on her left inner thigh.  They have been there for about 3 weeks.  Initially they were painful and now they are not painful but there is still there.  She went to an urgent care and she was told it was thrombophlebitis, then she went to her primary care doctor who told her they were swollen lymph nodes.  The primary care doctor ordered an outpatient ultrasound but it is not for another 2 months and she is extremely anxious and worried that she has something really bad going on with her.  She has been searching the Internet and is concerned she may have a sarcoma or something else like that.  She also said that she does not understand why, if it is swollen lymph nodes from an infection, does she not know what kind of infection she has.  She said that every day it worries her and it is all she thinks about when she is not at work and it is keeping her from getting sleep.  She denies fever, sore throat, chest pain or shortness of breath, nausea, vomiting, abdominal pain, and dysuria.  The lumps are no longer painful and there is no redness or swelling.  No difficulty with ambulation.       Past Medical History:  Diagnosis Date   Gastritis     Patient Active Problem List   Diagnosis Date Noted   ALLERGIC RHINITIS DUE TO POLLEN 01/05/2009   CHICKENPOX, HX OF 01/05/2009    History reviewed. No pertinent surgical history.  Prior to Admission medications   Medication Sig Start Date End Date Taking? Authorizing Provider  chlorhexidine (PERIDEX) 0.12 % solution Use as directed  15 mLs in the mouth or throat 2 (two) times daily. Patient not taking: Reported on 09/09/2018 07/18/18   Darel Hong, MD  dicyclomine (BENTYL) 20 MG tablet Take 1 tablet (20 mg total) by mouth 3 (three) times daily as needed for spasms. Patient not taking: Reported on 09/09/2018 02/10/18 02/10/19  Harvest Dark, MD  famotidine (PEPCID) 40 MG tablet Take 1 tablet (40 mg total) by mouth every evening. 04/03/17 04/03/18  Loney Hering, MD  metoCLOPramide (REGLAN) 10 MG tablet Take 1 tablet (10 mg total) by mouth every 8 (eight) hours as needed. Patient not taking: Reported on 09/09/2018 04/03/17   Loney Hering, MD  omeprazole (PRILOSEC) 20 MG capsule Take 1 capsule (20 mg total) by mouth daily. Patient not taking: Reported on 04/02/2017 04/20/15   Gregor Hams, MD  ondansetron (ZOFRAN ODT) 4 MG disintegrating tablet Take 1 tablet (4 mg total) by mouth every 8 (eight) hours as needed for nausea or vomiting. Patient not taking: Reported on 09/09/2018 02/10/18   Harvest Dark, MD  ondansetron (ZOFRAN) 4 MG tablet Take 1 tablet (4 mg total) by mouth every 8 (eight) hours as needed for nausea or vomiting. Patient not taking: Reported on 04/02/2017 03/04/15   Lisa Roca, MD  polyethylene glycol powder (GLYCOLAX/MIRALAX) powder Take 17 g by mouth daily as needed for moderate constipation. Patient not taking: Reported on 09/09/2018 02/10/18  Harvest Dark, MD  sucralfate (CARAFATE) 1 g tablet Take 1 g by mouth daily as needed. 10/19/16 10/19/17  [provider]  sucralfate (CARAFATE) 1 GM/10ML suspension Take 10 mLs (1 g total) by mouth 4 (four) times daily. 04/03/17 04/03/18  Loney Hering, MD    Allergies Nickel  Family History  Problem Relation Age of Onset   Breast cancer Maternal Grandmother     Social History Social History   Tobacco Use   Smoking status: Some Days    Pack years: 0.00   Smokeless tobacco: Never  Vaping Use   Vaping Use: Never used  Substance  Use Topics   Alcohol use: Yes   Drug use: No    Review of Systems Constitutional: No fever/chills Cardiovascular: Denies chest pain. Respiratory: Denies shortness of breath. Gastrointestinal: No abdominal pain.  No nausea, no vomiting.   Genitourinary: Negative for dysuria. Musculoskeletal: Negative for neck pain.  Negative for back pain. Integumentary: 2 lumps in her skin on the left upper inner thigh. Neurological: Negative for headaches, focal weakness or numbness.   ____________________________________________   PHYSICAL EXAM:  VITAL SIGNS: ED Triage Vitals  Enc Vitals Group     BP 12/24/20 2233 (!) 153/96     Pulse Rate 12/24/20 2233 (!) 102     Resp 12/24/20 2233 18     Temp 12/24/20 2233 98.7 F (37.1 C)     Temp Source 12/24/20 2233 Oral     SpO2 12/24/20 2233 100 %     Weight 12/24/20 2234 86.2 kg (190 lb)     Height 12/24/20 2234 1.803 m (5\' 11" )     Head Circumference --      Peak Flow --      Pain Score 12/24/20 2233 0     Pain Loc --      Pain Edu? --      Excl. in [place]? --     Constitutional: Alert and oriented.  Eyes: Conjunctivae are normal.  Head: Atraumatic. Cardiovascular: Normal rate, regular rhythm. Good peripheral circulation. Respiratory: Normal respiratory effort.  No retractions. Musculoskeletal: No lower extremity tenderness nor edema. No gross deformities of extremities. Neurologic:  Normal speech and language. No gross focal neurologic deficits are appreciated.  Skin:  Skin is warm, dry and intact.  No visible abnormalities on the left upper inner thigh, but with gentle palpation of her leg, I can identify 2 firm but rubbery nodules approximately 1 cm in diameter that are within about 2 cm of each other.  They are nontender and nonerythematous and feel consistent with lymphadenopathy.  There is no surrounding infection, cellulitis, induration, nor fluctuance.  They are nontender to palpation. Psychiatric: Mood and affect are anxious and  worried but relatively understandable under the circumstances.  ____________________________________________   RADIOLOGY Ursula Alert, personally viewed and evaluated these images (plain radiographs) as part of my medical decision making, as well as reviewing the written report by the radiologist.  ED MD interpretation: Nonspecific lesions, not consistent with infection, tumor, or anything in particular.  Official radiology report(s): [place] LT LOWER EXTREM LTD SOFT TISSUE NON VASCULAR  Result Date: 12/25/2020 CLINICAL DATA:  Two discrete lesions in left inner upper thigh most consistent with lymphadenopathy. Evaluate for possible infection versus tumor. EXAM: ULTRASOUND LEFT LOWER EXTREMITY LIMITED TECHNIQUE: Ultrasound examination of the lower extremity soft tissues was performed in the area of clinical concern. COMPARISON:  None. FINDINGS: Targeted sonographic evaluation of the areas of clinical concern. Area 1 labeled proximal  thigh demonstrates complex hypoechoic area with well-defined margins measuring 0.8 x 0.8 x 0.4 cm in the subcutaneous tissues. This is some peripheral but no internal vascularity. There is both a hypoechoic and more echogenic component with low level internal echoes. This does not have normal lymph node morphology. There is an adjacent lymph node with fatty hilum. Area 2 labeled mid thigh demonstrates a similar complex hypoechoic area with well-defined margins measuring 0.9 x 1.2 x 0.8 cm in the subcutaneous tissues with low level internal echoes and a small hypoechoic component. There is no internal vascularity. This does not have normal lymph node morphology. IMPRESSION: Areas of clinical concern in the left thigh demonstrates small hypoechoic lesions in the subcutaneous tissues, 0.8 cm and 1.2 cm. These are nonspecific by ultrasound, however do not have the typical sonographic appearance of lymph nodes. These do not have the typical appearance of abscess. MRI would be the  test of choice for further characterization. This could be performed on a nonemergent basis. Electronically Signed   By: Keith Rake M.D.   On: 12/25/2020 02:14    ____________________________________________   INITIAL IMPRESSION / MDM / ASSESSMENT AND PLAN / ED COURSE  As part of my medical decision making, I reviewed the following data within the [facility] notes reviewed and incorporated, Old chart reviewed, Discussed with radiologist, and Notes from prior ED visits   Differential diagnosis includes, but is not limited to, nonspecific lymphadenopathy, abscess, tumor, blood clot.  Patient has no systemic signs or symptoms of infection.  Reassuring physical exam.  The exam is most consistent with some benign lymphadenopathy.  Given the degree of anxiety the patient is experiencing and how her worry is affecting her life, I agreed to obtain a soft tissue ultrasound to try and make her feel more comfortable.  However I explained that it is likely I will not be able to give her a specific diagnosis.  There is no indication for any further testing or imaging at this time.  We will talk about the results but she would likely be appropriate for outpatient follow-up.  She understands and agrees with this plan.     Clinical Course as of 12/25/20 0311  Sat Dec 25, 2020  0303 [place] LT LOWER EXTREM LTD SOFT TISSUE NON VASCULAR Results of the ultrasound are nonspecific.  I called and spoke by phone with Dr. Joelyn Oms with radiology.  She ask about any history of trauma and the patient initially told me that there was no trauma, but Dr. Joelyn Oms said that it has an appearance similar to resolving hematomas but does not look like anything in particular.  Regardless she said that it is nothing emergent and can be followed up as an outpatient as needed with an MRI with and without contrast.  She pointed out, and I agree, that it is not an appropriate situation or study to be obtained in  the emergency department.  I have dated the patient about the results.  She said that actually she had liposuction in that area about 8 months ago.  It is possible that these are still resolving hematomas from that time given how long it takes for hematomas to be reabsorbed by the body.  Regardless I explained that there is no indication of an emergent condition and that she can follow-up with her primary care doctor to discuss whether or not they want to obtain an outpatient MRI.  She is comfortable with that plan.  I provided information  in her AVS about the results and she knows she can also find the results of the report in my chart. [CF]    Clinical Course User Index [CF] Hinda Kehr, MD     ____________________________________________  FINAL CLINICAL IMPRESSION(S) / ED DIAGNOSES  Final diagnoses:  Lump of thigh     MEDICATIONS GIVEN DURING THIS VISIT:  Medications - No data to display   ED Discharge Orders     None        Note:  This document was prepared using Dragon voice recognition software and may include unintentional dictation errors.   Hinda Kehr, MD 12/25/20 [phone]

## 2020-12-25 NOTE — Discharge Instructions (Addendum)
As we discussed, your ultrasound was generally reassuring.  There was not a specific diagnosis that matches the imaging, but it does not appear consistent with an infection, swollen lymph nodes, or a tumor.  The radiologist said that for better visualization and characterization of the lesions, the best option is to obtain an outpatient MRI with and without contrast of your thigh.  You can discuss this with your primary care provider.  However, it is possible and perhaps even likely that these lesions are slowly reabsorbing hematomas from the liposuction you had towards the end of last year.  Please follow-up with your primary care doctor to discuss whether or not it is appropriate to order an MRI as an outpatient and she can help you further.    Return to the emergency department if you develop new or worsening symptoms that concern you.

## 2020-12-25 NOTE — ED Notes (Signed)
US at bedside

## 2021-01-07 ENCOUNTER — Other Ambulatory Visit: Payer: Self-pay | Admitting: Family

## 2021-01-07 ENCOUNTER — Other Ambulatory Visit (HOSPITAL_COMMUNITY): Payer: Self-pay | Admitting: Family

## 2021-01-07 DIAGNOSIS — R2242 Localized swelling, mass and lump, left lower limb: Secondary | ICD-10-CM

## 2021-01-15 ENCOUNTER — Ambulatory Visit
Admission: RE | Admit: 2021-01-15 | Discharge: 2021-01-15 | Disposition: A | Discharge status: Home / Self Care | Payer: PRIVATE HEALTH INSURANCE | Source: Ambulatory Visit | Attending: Family | Admitting: Family

## 2021-01-15 DIAGNOSIS — R2242 Localized swelling, mass and lump, left lower limb: Secondary | ICD-10-CM | POA: Insufficient documentation

## 2021-02-02 ENCOUNTER — Other Ambulatory Visit: Payer: Self-pay

## 2021-02-02 ENCOUNTER — Encounter: Payer: Self-pay | Admitting: Family Medicine

## 2021-02-02 ENCOUNTER — Ambulatory Visit: Payer: Self-pay | Admitting: Family Medicine

## 2021-02-02 DIAGNOSIS — Z113 Encounter for screening for infections with a predominantly sexual mode of transmission: Secondary | ICD-10-CM

## 2021-02-02 DIAGNOSIS — B009 Herpesviral infection, unspecified: Secondary | ICD-10-CM | POA: Insufficient documentation

## 2021-02-02 DIAGNOSIS — B9689 Other specified bacterial agents as the cause of diseases classified elsewhere: Secondary | ICD-10-CM

## 2021-02-02 LAB — HM HIV SCREENING LAB: HM HIV Screening: NEGATIVE

## 2021-02-02 LAB — WET PREP FOR TRICH, YEAST, CLUE
Trichomonas Exam: NEGATIVE
Yeast Exam: NEGATIVE

## 2021-02-02 MED ORDER — METRONIDAZOLE 500 MG PO TABS: 500.0000 mg | ORAL_TABLET | Freq: Two times a day (BID) | ORAL | 0 refills | Status: AC

## 2021-02-02 MED ORDER — ACYCLOVIR 800 MG PO TABS: 800.0000 mg | ORAL_TABLET | Freq: Two times a day (BID) | ORAL | 12 refills | Status: AC

## 2021-02-02 NOTE — Progress Notes (Signed)
Pt here for STD screening.  Wet mount results reviewed, medication dispensed per Provider orders.  Windle Guard, RN

## 2021-02-02 NOTE — Progress Notes (Signed)
[facility] Department STI clinic/screening visit  Subjective:  Rose Campos is a 30 y.o. female being seen today for an STI screening visit. The patient reports they do have symptoms.  Patient reports that they  are unsure if they   desire a pregnancy in the next year.   They reported they are not interested in discussing contraception today.  Patient's last menstrual period was 01/13/2021 (approximate).   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    Last HIV test per patient/review of record was ~2-3 years ago.   Patient reports last pap was 09/09/2018.   See flowsheet for further details and programmatic requirements.    The following portions of the patient's history were reviewed and updated as appropriate: allergies, current medications, past medical history, past social history, past surgical history and problem list.  Objective:  There were no vitals filed for this visit.  Physical Exam Vitals and nursing note reviewed.  Constitutional:      Appearance: Normal appearance.  HENT:     Head: Normocephalic and atraumatic.     Mouth/Throat:     Mouth: Mucous membranes are moist.     Pharynx: Oropharynx is clear. No oropharyngeal exudate or posterior oropharyngeal erythema.  Pulmonary:     Effort: Pulmonary effort is normal.  Chest:  Breasts:    Right: No axillary adenopathy or supraclavicular adenopathy.     Left: No axillary adenopathy or supraclavicular adenopathy.  Abdominal:     General: Abdomen is flat.     Palpations: There is no mass.     Tenderness: There is no abdominal tenderness. There is no  rebound.  Genitourinary:    General: Normal vulva.     Exam position: Lithotomy position.     Pubic Area: No rash or pubic lice.      Labia:        Right: No rash or lesion.        Left: No rash or lesion.      Vagina: Normal. No vaginal discharge, erythema, bleeding or lesions.     Cervix: No cervical motion tenderness, discharge, friability, lesion or erythema.     Uterus: Normal.      Adnexa: Right adnexa normal and left adnexa normal.     Rectum: Normal.     Comments: External genitalia without, lice, nits, erythema, edema , lesions or inguinal adenopathy. Vagina with normal mucosa and white thick discharge and pH > 4.  Cervix without visual lesions, uterus firm, mobile, non-tender, no masses, CMT adnexal fullness or tenderness.   Lymphadenopathy:     Head:     Right side of head: No preauricular or posterior auricular adenopathy.     Left side of head: No preauricular or posterior auricular adenopathy.     Cervical: No cervical adenopathy.     Upper Body:     Right upper body: No supraclavicular or axillary adenopathy.     Left upper body: No supraclavicular or axillary adenopathy.     Lower Body: No right inguinal adenopathy. No left inguinal adenopathy.  Skin:    General: Skin is  warm and dry.     Findings: No rash.  Neurological:     Mental Status: She is alert and oriented to person, place, and time.  Psychiatric:        Behavior: Behavior normal.     Assessment and Plan:  Rose Campos is a 30 y.o. female presenting to the [facility] Department for STI screening  1. Screening examination for venereal disease  - Chlamydia/Gonorrhea [facility] - HIV [place] [facility] - Syphilis Serology, [facility] [facility] - WET PREP FOR [place], [place], CLUE  Patient accepted all screenings including wet prep, vaginal CT/GC and bloodwork for HIV/RPR.  Patient meets criteria for HepB screening? No. Ordered? No - does not meet criteria  Patient meets criteria for HepC  screening? No. Ordered? No - does not meet criteria      2. Bacterial vaginosis  - metroNIDAZOLE (FLAGYL) 500 MG tablet; Take 1 tablet (500 mg total) by mouth 2 (two) times daily for 7 days.  Dispense: 14 tablet; Refill: 0  Wet prep results + amine, + clue    Treatment needed for BV  Discussed time line for [facility] results and that patient will be called with positive results and encouraged patient to call if she had not heard in 2 weeks.  Counseled to return or seek care for continued or worsening symptoms Recommended condom use with all sex  Patient is currently using no BCM v to prevent pregnancy.  3. 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    - acyclovir (ZOVIRAX) 800 MG tablet; Take 1 tablet (800 mg total) by mouth 2 (two) times daily for 5 days.  Dispense: 10 tablet; Refill: 12      Return for as needed.  No future appointments.  Junious Dresser, FNP

## 2021-06-22 ENCOUNTER — Other Ambulatory Visit: Payer: Self-pay

## 2021-06-22 ENCOUNTER — Encounter: Payer: Self-pay | Admitting: Emergency Medicine

## 2021-06-22 ENCOUNTER — Emergency Department
Admission: EM | Admit: 2021-06-22 | Discharge: 2021-06-22 | Disposition: A | Discharge status: Home / Self Care | Payer: PRIVATE HEALTH INSURANCE | Attending: Emergency Medicine | Admitting: Emergency Medicine

## 2021-06-22 ENCOUNTER — Emergency Department: Payer: PRIVATE HEALTH INSURANCE

## 2021-06-22 DIAGNOSIS — R059 Cough, unspecified: Secondary | ICD-10-CM | POA: Diagnosis present

## 2021-06-22 DIAGNOSIS — U071 COVID-19: Secondary | ICD-10-CM | POA: Diagnosis not present

## 2021-06-22 DIAGNOSIS — F17210 Nicotine dependence, cigarettes, uncomplicated: Secondary | ICD-10-CM | POA: Insufficient documentation

## 2021-06-22 LAB — CBC
HCT: 43.4 % (ref 36.0–46.0)
Hemoglobin: 15 g/dL (ref 12.0–15.0)
MCH: 29.4 pg (ref 26.0–34.0)
MCHC: 34.6 g/dL (ref 30.0–36.0)
MCV: 85.1 fL (ref 80.0–100.0)
Platelets: 275 10*3/uL (ref 150–400)
RBC: 5.1 MIL/uL (ref 3.87–5.11)
RDW: 13.2 % (ref 11.5–15.5)
WBC: 9.6 10*3/uL (ref 4.0–10.5)
nRBC: 0 % (ref 0.0–0.2)

## 2021-06-22 LAB — BASIC METABOLIC PANEL
Anion gap: 8 (ref 5–15)
BUN: 9 mg/dL (ref 6–20)
CO2: 23 mmol/L (ref 22–32)
Calcium: 9.7 mg/dL (ref 8.9–10.3)
Chloride: 105 mmol/L (ref 98–111)
Creatinine, Ser: 0.76 mg/dL (ref 0.44–1.00)
GFR, Estimated: >60 mL/min (ref >60)
Glucose, Bld: 89 mg/dL (ref 70–99)
Potassium: 3.6 mmol/L (ref 3.5–5.1)
Sodium: 136 mmol/L (ref 135–145)

## 2021-06-22 LAB — TROPONIN I (HIGH SENSITIVITY): Troponin I (High Sensitivity): 3 ng/L (ref <18)

## 2021-06-22 MED ORDER — ONDANSETRON HCL 4 MG/2ML IJ SOLN
4.0000 mg | Freq: Once | INTRAMUSCULAR | Status: AC
  Administered 2021-06-22: 14:00:00 4 mg via INTRAVENOUS
  Filled 2021-06-22: qty 2

## 2021-06-22 MED ORDER — PREDNISONE 50 MG PO TABS
50.0000 mg | ORAL_TABLET | Freq: Every day | ORAL | 0 refills | Status: DC
Start: 2021-06-22 — End: 2022-03-05
  Filled 2021-06-22: qty 5, 5d supply, fill #0

## 2021-06-22 MED ORDER — PSEUDOEPH-BROMPHEN-DM 30-2-10 MG/5ML PO SYRP
10.0000 mL | ORAL_SOLUTION | Freq: Four times a day (QID) | ORAL | 0 refills | Status: DC | PRN
Start: 2021-06-22 — End: 2022-03-05
  Filled 2021-06-22: qty 200, 5d supply, fill #0

## 2021-06-22 MED ORDER — BENZONATATE 100 MG PO CAPS
100.0000 mg | ORAL_CAPSULE | Freq: Three times a day (TID) | ORAL | 0 refills | Status: DC | PRN
Start: 2021-06-22 — End: 2022-03-05
  Filled 2021-06-22: qty 30, 10d supply, fill #0

## 2021-06-22 MED ORDER — PANTOPRAZOLE SODIUM 40 MG PO TBEC
40.0000 mg | DELAYED_RELEASE_TABLET | Freq: Every day | ORAL | 1 refills | Status: DC
Start: 2021-06-22 — End: 2022-03-05
  Filled 2021-06-22: qty 30, 30d supply, fill #0
  Filled 2022-02-04: qty 30, 30d supply, fill #1

## 2021-06-22 MED ORDER — LOPERAMIDE HCL 2 MG PO TABS
2.0000 mg | ORAL_TABLET | Freq: Four times a day (QID) | ORAL | 0 refills | Status: DC | PRN
Start: 2021-06-22 — End: 2022-11-29
  Filled 2021-06-22 – 2022-02-04 (×2): qty 30, 8d supply, fill #0

## 2021-06-22 MED ORDER — ONDANSETRON 4 MG PO TBDP
4.0000 mg | ORAL_TABLET | Freq: Three times a day (TID) | ORAL | 1 refills | Status: DC | PRN
Start: 2021-06-22 — End: 2022-03-05
  Filled 2021-06-22: qty 20, 7d supply, fill #0
  Filled 2022-02-04: qty 20, 7d supply, fill #1

## 2021-06-22 MED ORDER — SUCRALFATE 1 G PO TABS
1.0000 g | ORAL_TABLET | Freq: Four times a day (QID) | ORAL | 1 refills | Status: DC
Start: 2021-06-22 — End: 2022-03-05
  Filled 2021-06-22: qty 120, 30d supply, fill #0
  Filled 2022-02-04: qty 120, 30d supply, fill #1

## 2021-06-22 NOTE — ED Notes (Signed)
Pt to stat desk asking if we can "start fluids because I am dehydrated." Pt made aware that we could not start IV fluids while she is in the waiting rm.

## 2021-06-22 NOTE — ED Notes (Signed)
Pt did test POSITIVE for COVID at [facility]

## 2021-06-22 NOTE — ED Provider Notes (Signed)
[facility] Emergency Department Provider Note  ____________________________________________  Time seen: Approximately 3:26 PM  I have reviewed the triage vital signs and the nursing notes.   HISTORY  Chief Complaint Nasal Congestion, Emesis, Weakness, and Shortness of Breath    HPI Rose Campos is a 30 y.o. female presents complaining of congestion, cough, nausea, vomiting, diarrhea.  Patient had viral URI symptoms roughly 2 weeks ago, had not fully resolved when she felt like she became sick again.  She went to urgent care today, was tested positive for COVID.  While there she started hyperventilating no symptoms emergency department for evaluation.  Patient denies any fevers at this time, no difficulty breathing or swallowing.  Patient is no longer hyperventilating.  No chest pain, abdominal pain.       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 No pertinent surgical history.  Prior to Admission medications   Medication Sig Start Date End Date Taking? Authorizing Provider  benzonatate (TESSALON PERLES) 100 MG capsule Take 1 capsule (100 mg total) by mouth 3 (three) times daily as needed for cough. 06/22/21 06/22/22 Yes Anthany Thornhill, Charline Bills, PA-C  brompheniramine-pseudoephedrine-DM 30-2-10 MG/5ML syrup Take 10 mLs by mouth 4 (four) times daily as needed. 06/22/21  Yes Sandara Tyree, Charline Bills, PA-C  loperamide (IMODIUM A-D) 2 MG tablet Take 1 tablet (2 mg total) by mouth 4 (four) times daily as needed for diarrhea or loose stools. 06/22/21  Yes Grazia Taffe, Charline Bills, PA-C  ondansetron (ZOFRAN-ODT) 4 MG disintegrating tablet Take 1 tablet (4 mg total) by mouth every 8 (eight) hours as needed for nausea or vomiting. 06/22/21  Yes Doniesha Landau,  Charline Bills, PA-C  pantoprazole (PROTONIX) 40 MG tablet Take 1 tablet (40 mg total) by mouth daily. 06/22/21 06/22/22 Yes Desera Graffeo, Charline Bills, PA-C  predniSONE (DELTASONE) 50 MG tablet Take 1 tablet (50 mg total) by mouth daily with breakfast. 06/22/21  Yes Nelton Amsden, Charline Bills, PA-C  sucralfate (CARAFATE) 1 g tablet Take 1 tablet (1 g total) by mouth 4 (four) times daily. 06/22/21 06/22/22 Yes Bethzaida Boord, Charline Bills, PA-C  chlorhexidine (PERIDEX) 0.12 % solution Use as directed 15 mLs in the mouth or throat 2 (two) times daily. Patient not taking: Reported on 09/09/2018 07/18/18   Darel Hong, MD  dicyclomine (BENTYL) 20 MG tablet Take 1 tablet (20 mg total) by mouth 3 (three) times daily as needed for spasms. Patient not taking: Reported on 09/09/2018 02/10/18 02/10/19  Harvest Dark, MD  famotidine (PEPCID) 40 MG tablet Take 1 tablet (40 mg total) by mouth every evening. 04/03/17 04/03/18  Loney Hering, MD  metoCLOPramide (REGLAN) 10 MG tablet Take 1 tablet (10 mg total) by mouth every 8 (eight) hours as needed. Patient not taking: Reported on 09/09/2018 04/03/17   Loney Hering, MD  omeprazole (PRILOSEC) 20 MG capsule Take 1 capsule (20 mg total) by mouth daily. Patient not taking: Reported on 04/02/2017 04/20/15   Gregor Hams, MD  ondansetron (ZOFRAN) 4 MG tablet Take 1 tablet (4 mg total) by mouth every 8 (eight) hours as needed for nausea or vomiting. Patient not taking: Reported on 04/02/2017 03/04/15   Lisa Roca, MD  polyethylene glycol powder (GLYCOLAX/MIRALAX) powder Take 17 g by mouth daily as needed for  moderate constipation. Patient not taking: Reported on 09/09/2018 02/10/18   Harvest Dark, MD    Allergies Nickel  Family History  Problem Relation Age of Onset   Breast cancer Maternal Grandmother     Social History Social History   Tobacco Use   Smoking status: Some Days    Years: 5.00    Types: Cigarettes   Smokeless tobacco: Never  Vaping Use    Vaping Use: Never used  Substance Use Topics   Alcohol use: Yes    Alcohol/week: 1.0 standard drink    Types: 1 Glasses of wine per week    Comment: ocassionally   Drug use: No     Review of Systems  Constitutional: No fever/chills Eyes: No visual changes. No discharge ENT: Positive for nasal congestion Cardiovascular: no chest pain. Respiratory: Positive cough. No SOB. Gastrointestinal: No abdominal pain.  Positive for nausea, vomiting, diarrhea.  No constipation. Genitourinary: Negative for dysuria. No hematuria Musculoskeletal: Negative for musculoskeletal pain. Skin: Negative for rash, abrasions, lacerations, ecchymosis. Neurological: Negative for headaches, focal weakness or numbness.  10 System ROS otherwise negative.  ____________________________________________   PHYSICAL EXAM:  VITAL SIGNS: ED Triage Vitals [06/22/21 1330]  Enc Vitals Group     BP 123/74     Pulse Rate (!) 121     Resp (!) 22     Temp 99.7 F (37.6 C)     Temp Source Oral     SpO2 100 %     Weight 190 lb (86.2 kg)     Height 5\' 11"  (1.803 m)     Head Circumference      Peak Flow      Pain Score 8     Pain Loc      Pain Edu?      Excl. in [place]?      Constitutional: Alert and oriented. Well appearing and in no acute distress. Eyes: Conjunctivae are normal. PERRL. EOMI. Head: Atraumatic. ENT:      Ears:       Nose: No congestion/rhinnorhea.      Mouth/Throat: Mucous membranes are moist.  Neck: No stridor.  Neck is supple full range of motion.  No tenderness on exam Hematological/Lymphatic/Immunilogical: No cervical lymphadenopathy. Cardiovascular: Normal rate, regular rhythm. Normal S1 and S2.  Good peripheral circulation. Respiratory: Normal respiratory effort without tachypnea or retractions. Lungs CTAB. Good air entry to the bases with no decreased or absent breath sounds. Gastrointestinal: Bowel sounds 4 quadrants. Soft and nontender to palpation. No guarding or rigidity. No  palpable masses. No distention. No CVA tenderness Musculoskeletal: Full range of motion to all extremities. No gross deformities appreciated. Neurologic:  Normal speech and language. No gross focal neurologic deficits are appreciated.  Skin:  Skin is warm, dry and intact. No rash noted. Psychiatric: Mood and affect are normal. Speech and behavior are normal. Patient exhibits appropriate insight and judgement.   ____________________________________________   LABS (all labs ordered are listed, but only abnormal results are displayed)  Labs Reviewed  BASIC METABOLIC PANEL  CBC  POC URINE PREG, ED  TROPONIN I (HIGH SENSITIVITY)  TROPONIN I (HIGH SENSITIVITY)   ____________________________________________  EKG   ____________________________________________  RADIOLOGY I personally viewed and evaluated these images as part of my medical decision making, as well as reviewing the written report by the radiologist.  ED Provider Interpretation: No consolidations concerning for pneumonia  DG Chest 2 View  Result Date: 06/22/2021 CLINICAL DATA:  Shortness of breath EXAM: CHEST - 2 VIEW COMPARISON:  None. FINDINGS: Heart size and mediastinal contours are within normal limits. No suspicious pulmonary opacities identified. No pleural effusion or pneumothorax visualized. No acute osseous abnormality appreciated. IMPRESSION: No acute intrathoracic process identified. Electronically Signed   By: Ofilia Neas M.D.   On: 06/22/2021 14:19    ____________________________________________    PROCEDURES  Procedure(s) performed:    Procedures    Medications  ondansetron (ZOFRAN) injection 4 mg (4 mg Intravenous Given 06/22/21 1347)     ____________________________________________   INITIAL IMPRESSION / ASSESSMENT AND PLAN / ED COURSE  Pertinent labs & imaging results that were available during my care of the patient were reviewed by me and considered in my medical decision making  (see chart for details).  Review of the [place] CSRS was performed in accordance of the [place] prior to dispensing any controlled drugs.           Patient's diagnosis is consistent with COVID-19.  Patient arrives to the emergency department with COVID-19 symptoms.  She been referred to the emergency department from urgent care after patient started hyperventilating.  Patient is no longer hyperventilating, no increased work of breathing.  Exam is reassuring.  Labs, chest x-ray is reassuring at this time.  Patient will have multiple symptom control medications for her COVID-19.  Return precautions discussed with the patient.  Follow-up primary care as needed. Patient is given ED precautions to return to the ED for any worsening or new symptoms.     ____________________________________________  FINAL CLINICAL IMPRESSION(S) / ED DIAGNOSES  Final diagnoses:  COVID-19      NEW MEDICATIONS STARTED DURING THIS VISIT:  ED Discharge Orders          Ordered    ondansetron (ZOFRAN-ODT) 4 MG disintegrating tablet  Every 8 hours PRN        06/22/21 1544    pantoprazole (PROTONIX) 40 MG tablet  Daily        06/22/21 1544    sucralfate (CARAFATE) 1 g tablet  4 times daily        06/22/21 1544    brompheniramine-pseudoephedrine-DM 30-2-10 MG/5ML syrup  4 times daily PRN        06/22/21 1544    benzonatate (TESSALON PERLES) 100 MG capsule  3 times daily PRN        06/22/21 1544    loperamide (IMODIUM A-D) 2 MG tablet  4 times daily PRN        06/22/21 1544    predniSONE (DELTASONE) 50 MG tablet  Daily with breakfast        06/22/21 1544                This chart was dictated using voice recognition software/Dragon. Despite best efforts to proofread, errors can occur which can change the meaning. Any change was purely unintentional.    Darletta Moll, PA-C 06/22/21 1547    Harvest Dark, MD 06/22/21 2258

## 2021-06-22 NOTE — ED Triage Notes (Signed)
Pt brought over by [facility] with c/o SHOB, body aches, fever, weakness, this all began about 2 weeks ago, has gotten worse. Pt reports that she was hyperventilating at [facility] and they put her on O2. Pt tearful in triage. She has vomited also and feels like she is dehydrated.

## 2021-08-24 ENCOUNTER — Other Ambulatory Visit: Payer: Self-pay

## 2021-09-17 ENCOUNTER — Other Ambulatory Visit: Payer: Self-pay

## 2021-09-17 ENCOUNTER — Emergency Department: Payer: PRIVATE HEALTH INSURANCE

## 2021-09-17 ENCOUNTER — Emergency Department
Admission: EM | Admit: 2021-09-17 | Discharge: 2021-09-17 | Disposition: A | Discharge status: Home / Self Care | Payer: PRIVATE HEALTH INSURANCE | Attending: Emergency Medicine | Admitting: Emergency Medicine

## 2021-09-17 DIAGNOSIS — Y9364 Activity, baseball: Secondary | ICD-10-CM | POA: Diagnosis not present

## 2021-09-17 DIAGNOSIS — S60111A Contusion of right thumb with damage to nail, initial encounter: Secondary | ICD-10-CM | POA: Insufficient documentation

## 2021-09-17 DIAGNOSIS — W2103XA Struck by baseball, initial encounter: Secondary | ICD-10-CM | POA: Insufficient documentation

## 2021-09-17 DIAGNOSIS — S6991XA Unspecified injury of right wrist, hand and finger(s), initial encounter: Secondary | ICD-10-CM | POA: Diagnosis present

## 2021-09-17 NOTE — ED Notes (Signed)
E signature pad not working. Pt educated on discharge instructions and verbalized understanding.  

## 2021-09-17 NOTE — Discharge Instructions (Addendum)
Your x-ray is negative, and only shows some soft tissue swelling.  You may apply ice to reduce swelling.  Take over-the-counter Tylenol or Motrin for pain and inflammation. ?

## 2021-09-17 NOTE — ED Triage Notes (Signed)
Pt was at a batting cage this afternoon. Ball hit right thumb. Pt c/o limited range of motion, pain and swelling.  ?

## 2021-09-18 NOTE — ED Provider Notes (Signed)
? ? ?[facility] ?Emergency Department Provider Note ? ? ? ? Event Date/Time  ? First MD Initiated Contact with Patient 09/17/21 2003   ?  (approximate) ? ? ?History  ? ?Finger Injury ? ? ?HPI ? ?Rose Campos is a 31 y.o. female who is right-hand dominant,Presents to the ED after a baseball from a pitching machine in a batting cage, hit her on the right thumb. She presents to the ED with right thumb pain, swelling, and decreased range of motion ?  ? ? ?Physical Exam  ? ?Triage Vital Signs: ?ED Triage Vitals  ?Enc Vitals Group  ?   BP 09/17/21 1920 (!) 141/71  ?   Pulse Rate 09/17/21 1920 (!) 101  ?   Resp 09/17/21 1920 18  ?   Temp 09/17/21 1920 98.9 ?F (37.2 ?C)  ?   Temp Source 09/17/21 1920 Oral  ?   SpO2 09/17/21 1920 99 %  ?   Weight 09/17/21 1921 180 lb (81.6 kg)  ?   Height --   ?   Head Circumference --   ?   Peak Flow --   ?   Pain Score 09/17/21 1920 4  ?   Pain Loc --   ?   Pain Edu? --   ?   Excl. in [place]? --   ? ? ?Most recent vital signs: ?Vitals:  ? 09/17/21 1920 09/17/21 2211  ?BP: (!) 141/71 (!) 141/73  ?Pulse: (!) 101 83  ?Resp: 18 18  ?Temp: 98.9 ?F (37.2 ?C)   ?SpO2: 99% 100%  ? ? ?General Awake, no distress.  ?CV:  Good peripheral perfusion.  ?RESP:  Normal effort.  ?ABD:  No distention.  ?MSK  Right thumb without deformity or dislocation. She has small superficial blood blister to the medial proximal phalanx. No nailbed injury noted. ? ? ?ED Results / Procedures / Treatments  ? ?Labs ?(all labs ordered are listed, but only abnormal results are displayed) ?Labs Reviewed - No data to display ? ? ?EKG ? ? ?RADIOLOGY ? ?I personally viewed and evaluated these images as part of my medical decision making, as well as reviewing the written report by the radiologist. ? ?ED Provider Interpretation: no acute fracture} ? ?DG Hand Complete Right ? ?Result Date: 09/17/2021 ?CLINICAL DATA:  RIGHT hand pain from softball injury today. Initial encounter. EXAM: RIGHT HAND - COMPLETE 3+  VIEW COMPARISON:  None. FINDINGS: There is no evidence of fracture or dislocation. There is no evidence of arthropathy or other focal bone abnormality. Soft tissues are unremarkable. IMPRESSION: Negative. Electronically Signed   By: Margarette Canada M.D.   On: 09/17/2021 20:14   ? ? ?PROCEDURES: ? ?Critical Care performed: No ? ?Procedures ? ? ?MEDICATIONS ORDERED IN ED: ?Medications - No data to display ? ? ?IMPRESSION / MDM / ASSESSMENT AND PLAN / ED COURSE  ?I reviewed the triage vital signs and the nursing notes. ?             ?               ? ?Differential diagnosis includes, but is not limited to, thumb fracture, dislocation, sprain ? ?Patient to the ED for evaluation of acute right thumb pain after being hit by a baseball.  She presents in no acute distress with some local pain and swelling.  No radiologic evidence of any acute fracture or dislocation.  Patient's clinical picture consistent with thumb contusion and superficial hematoma.  She was  placed in a thumb AlumaFoam brace for support.  A work note is provided as requested limiting right hand use secondary to the splint.  She will follow-up with primary provider or return to the ED if needed. ?  ? ? ?FINAL CLINICAL IMPRESSION(S) / ED DIAGNOSES  ? ?Final diagnoses:  ?Contusion of right thumb with damage to nail, initial encounter  ? ? ? ?Rx / DC Orders  ? ?ED Discharge Orders   ? ? None  ? ?  ? ? ? ?Note:  This document was prepared using Dragon voice recognition software and may include unintentional dictation errors. ? ?  ?Melvenia Needles, PA-C ?09/18/21 0011 ? ?  ?Carrie Mew, MD ?09/20/21 1017 ? ?

## 2022-01-10 ENCOUNTER — Ambulatory Visit: Payer: Self-pay

## 2022-02-04 ENCOUNTER — Telehealth: Payer: PRIVATE HEALTH INSURANCE | Admitting: Nurse Practitioner

## 2022-02-04 DIAGNOSIS — R198 Other specified symptoms and signs involving the digestive system and abdomen: Secondary | ICD-10-CM | POA: Diagnosis not present

## 2022-02-04 DIAGNOSIS — R194 Change in bowel habit: Secondary | ICD-10-CM

## 2022-02-04 NOTE — Progress Notes (Signed)
Virtual Visit Consent   Rose Campos, you are scheduled for a virtual visit with Mary-Margaret Hassell Done, [place], a [facility] provider, today.     Just as with appointments in the office, your consent must be obtained to participate.  Your consent will be active for this visit and any virtual visit you may have with one of our providers in the next 365 days.     If you have a MyChart account, a copy of this consent can be sent to you electronically.  All virtual visits are billed to your insurance company just like a traditional visit in the office.    As this is a virtual visit, video technology does not allow for your provider to perform a traditional examination.  This may limit your provider's ability to fully assess your condition.  If your provider identifies any concerns that need to be evaluated in person or the need to arrange testing (such as labs, EKG, etc.), we will make arrangements to do so.     Although advances in technology are sophisticated, we cannot ensure that it will always work on either your end or our end.  If the connection with a video visit is poor, the visit may have to be switched to a telephone visit.  With either a video or telephone visit, we are not always able to ensure that we have a secure connection.     I need to obtain your verbal consent now.   Are you willing to proceed with your visit today? YES   Rose Campos has provided verbal consent on 02/04/2022 for a virtual visit (video or telephone).   Mary-Margaret Hassell Done, FNP   Date: 02/04/2022 12:46 PM   Virtual Visit via Video Note   I, Mary-Margaret Hassell Done, connected with Rose Campos ([id number], 1991-01-26) on 02/04/22 at  1:00 PM EDT by a video-enabled telemedicine application and verified that I am speaking with the correct person using two identifiers.  Location: Patient: Virtual Visit Location Patient: Home Provider: Virtual Visit Location Provider: Mobile   I discussed the  limitations of evaluation and management by telemedicine and the availability of in person appointments. The patient expressed understanding and agreed to proceed.    History of Present Illness: Rose Campos is a 31 y.o. who identifies as a female who was assigned female at birth, and is being seen today for gi issues.  HPI: Patient states she has been have rectal pressure nd pain for about 4 weeks. She has had constipation and has had to due em=nemas and sometimes digital stimulation. Her bowel movements are very narrow. She has lower abdominal pain at times. Weight loss of anbot 20 lbs during th eyear without trying to lose weight. She is not able to eat meat , just does  not want it. She has been eating a lot of fruit and vegetables. She has had some nausea in mornings. SHe had colonoscopy in 2019 and had polyps that were removed but were not cancerous at that time.  SHe has beenon carafate, pepcid, protonix and imodium in the past. She has not filled those in awhile. She had pepcid, and carafate fieed today and will pick up after work.     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   ALLERGIC RHINITIS DUE TO POLLEN 01/05/2009   CHICKENPOX, HX OF 01/05/2009    Allergies:  Allergies  Allergen Reactions  Nickel Other (See Comments) and Rash   Medications:  Current Outpatient Medications:    benzonatate (TESSALON PERLES) 100 MG capsule, Take 1 capsule (100 mg total) by mouth 3 (three) times daily as needed for cough., Disp: 30 capsule, Rfl: 0   brompheniramine-pseudoephedrine-DM 30-2-10 MG/5ML syrup, Take 10 mLs by mouth 4 (four) times daily as needed., Disp: 200 mL, Rfl: 0   famotidine (PEPCID) 40 MG tablet, Take 1 tablet (40 mg total) by mouth every evening., Disp: 14 tablet, Rfl: 0   loperamide (IMODIUM A-D) 2 MG tablet, Take 1 tablet (2 mg total) by mouth 4 (four) times daily as needed for diarrhea or  loose stools., Disp: 30 tablet, Rfl: 0   metoCLOPramide (REGLAN) 10 MG tablet, Take 1 tablet (10 mg total) by mouth every 8 (eight) hours as needed. (Patient not taking: Reported on 09/09/2018), Disp: 20 tablet, Rfl: 0   ondansetron (ZOFRAN-ODT) 4 MG disintegrating tablet, Dissolve 1 tablet (4 mg total) in the mouth every 8 (eight) hours as needed for nausea or vomiting., Disp: 20 tablet, Rfl: 1   pantoprazole (PROTONIX) 40 MG tablet, Take 1 tablet (40 mg total) by mouth daily., Disp: 30 tablet, Rfl: 1   predniSONE (DELTASONE) 50 MG tablet, Take 1 tablet (50 mg total) by mouth daily with breakfast., Disp: 5 tablet, Rfl: 0   sucralfate (CARAFATE) 1 g tablet, Take 1 tablet (1 g total) by mouth 4 (four) times daily., Disp: 120 tablet, Rfl: 1  Observations/Objective: Patient is well-developed, well-nourished in no acute distress.  Resting comfortably  at home.  Head is normocephalic, atraumatic.  No labored breathing.  Speech is clear and coherent with logical content.  Patient is alert and oriented at baseline.    Assessment and Plan:  Rose Campos in today with chief complaint of gi issues   1. Change in bowel habits  2. Rectal pressure Start back on pepcid and carafate Increase fiber in diet Miralax daily with apple juice Patient has appointment with GI in October- suggested that she call GI office and get on cancellation wait list to move appointment to a sooner date.    Follow Up Instructions: I discussed the assessment and treatment plan with the patient. The patient was provided an opportunity to ask questions and all were answered. The patient agreed with the plan and demonstrated an understanding of the instructions.  A copy of instructions were sent to the patient via MyChart.  The patient was advised to call back or seek an in-person evaluation if the symptoms worsen or if the condition fails to improve as anticipated.  Time:  I spent 13 minutes with the patient via  telehealth technology discussing the above problems/concerns.    Mary-Margaret Hassell Done, FNP

## 2022-02-06 ENCOUNTER — Emergency Department (HOSPITAL_COMMUNITY): Payer: PRIVATE HEALTH INSURANCE

## 2022-02-06 ENCOUNTER — Encounter (HOSPITAL_COMMUNITY): Payer: Self-pay | Admitting: Emergency Medicine

## 2022-02-06 ENCOUNTER — Other Ambulatory Visit: Payer: Self-pay

## 2022-02-06 ENCOUNTER — Emergency Department (HOSPITAL_COMMUNITY)
Admission: EM | Admit: 2022-02-06 | Discharge: 2022-02-06 | Disposition: A | Discharge status: Home / Self Care | Payer: PRIVATE HEALTH INSURANCE | Attending: Emergency Medicine | Admitting: Emergency Medicine

## 2022-02-06 DIAGNOSIS — N898 Other specified noninflammatory disorders of vagina: Secondary | ICD-10-CM | POA: Insufficient documentation

## 2022-02-06 DIAGNOSIS — R102 Pelvic and perineal pain: Secondary | ICD-10-CM | POA: Insufficient documentation

## 2022-02-06 LAB — I-STAT BETA HCG BLOOD, ED (MC, WL, AP ONLY): I-stat hCG, quantitative: <5 m[IU]/mL (ref <5)

## 2022-02-06 LAB — URINALYSIS, ROUTINE W REFLEX MICROSCOPIC
Bacteria, UA: NONE SEEN
Bilirubin Urine: NEGATIVE
Glucose, UA: NEGATIVE mg/dL
Hgb urine dipstick: NEGATIVE
Ketones, ur: NEGATIVE mg/dL
Nitrite: NEGATIVE
Protein, ur: NEGATIVE mg/dL
Specific Gravity, Urine: 1.013 (ref 1.005–1.030)
pH: 7 (ref 5.0–8.0)

## 2022-02-06 LAB — COMPREHENSIVE METABOLIC PANEL
ALT: 11 U/L (ref 0–44)
AST: 14 U/L — ABNORMAL LOW (ref 15–41)
Albumin: 4.1 g/dL (ref 3.5–5.0)
Alkaline Phosphatase: 39 U/L (ref 38–126)
Anion gap: 8 (ref 5–15)
BUN: 9 mg/dL (ref 6–20)
CO2: 19 mmol/L — ABNORMAL LOW (ref 22–32)
Calcium: 9.1 mg/dL (ref 8.9–10.3)
Chloride: 110 mmol/L (ref 98–111)
Creatinine, Ser: 0.93 mg/dL (ref 0.44–1.00)
GFR, Estimated: >60 mL/min (ref >60)
Glucose, Bld: 95 mg/dL (ref 70–99)
Potassium: 3.9 mmol/L (ref 3.5–5.1)
Sodium: 137 mmol/L (ref 135–145)
Total Bilirubin: 0.4 mg/dL (ref 0.3–1.2)
Total Protein: 6.9 g/dL (ref 6.5–8.1)

## 2022-02-06 LAB — WET PREP, GENITAL
Clue Cells Wet Prep HPF POC: NONE SEEN
Sperm: NONE SEEN
Trich, Wet Prep: NONE SEEN
WBC, Wet Prep HPF POC: <10 (ref <10)
Yeast Wet Prep HPF POC: NONE SEEN

## 2022-02-06 LAB — CBC WITH DIFFERENTIAL/PLATELET
Abs Immature Granulocytes: 0.02 10*3/uL (ref 0.00–0.07)
Basophils Absolute: 0 10*3/uL (ref 0.0–0.1)
Basophils Relative: 0 %
Eosinophils Absolute: 0.2 10*3/uL (ref 0.0–0.5)
Eosinophils Relative: 2 %
HCT: 39.1 % (ref 36.0–46.0)
Hemoglobin: 13.4 g/dL (ref 12.0–15.0)
Immature Granulocytes: 0 %
Lymphocytes Relative: 30 %
Lymphs Abs: 2 10*3/uL (ref 0.7–4.0)
MCH: 30 pg (ref 26.0–34.0)
MCHC: 34.3 g/dL (ref 30.0–36.0)
MCV: 87.7 fL (ref 80.0–100.0)
Monocytes Absolute: 0.7 10*3/uL (ref 0.1–1.0)
Monocytes Relative: 10 %
Neutro Abs: 3.9 10*3/uL (ref 1.7–7.7)
Neutrophils Relative %: 58 %
Platelets: 289 10*3/uL (ref 150–400)
RBC: 4.46 MIL/uL (ref 3.87–5.11)
RDW: 13.2 % (ref 11.5–15.5)
WBC: 6.8 10*3/uL (ref 4.0–10.5)
nRBC: 0 % (ref 0.0–0.2)

## 2022-02-06 LAB — LIPASE, BLOOD: Lipase: 29 U/L (ref 11–51)

## 2022-02-06 LAB — HIV ANTIBODY (ROUTINE TESTING W REFLEX): HIV Screen 4th Generation wRfx: NONREACTIVE

## 2022-02-06 MED ORDER — CEFTRIAXONE SODIUM 500 MG IJ SOLR
500.0000 mg | Freq: Once | INTRAMUSCULAR | Status: AC
Start: 2022-02-06 — End: 2022-02-06
  Administered 2022-02-06: 500 mg via INTRAMUSCULAR
  Filled 2022-02-06: qty 500

## 2022-02-06 MED ORDER — DOXYCYCLINE HYCLATE 100 MG PO CAPS: 100.0000 mg | ORAL_CAPSULE | Freq: Two times a day (BID) | ORAL | 0 refills | Status: AC

## 2022-02-06 MED ORDER — METRONIDAZOLE 500 MG PO TABS: 500.0000 mg | ORAL_TABLET | Freq: Two times a day (BID) | ORAL | 0 refills | Status: AC

## 2022-02-06 MED ORDER — LIDOCAINE HCL (PF) 1 % IJ SOLN
INTRAMUSCULAR | Status: AC
  Administered 2022-02-06: 1 mL
  Filled 2022-02-06: qty 5

## 2022-02-06 NOTE — ED Triage Notes (Signed)
Patient sent to ED from PCP for evaluation of possible pelvic inflammatory disease. Patient complains of pelvic pain for the last few weeks that has started getting worse recently. Patient states she also concerned that she may have contracted and STD from her partner.

## 2022-02-06 NOTE — ED Provider Triage Note (Signed)
Emergency Medicine Provider Triage Evaluation Note  Rose Campos , a 31 y.o. female  was evaluated in triage.  Pt complains of pelvic pain which has been ongoing for 3 to 4 weeks.  She also complains of intermittent nausea that she thinks may be related to GERD which is been ongoing for months.  The patient also has vaginal discharge which has been intermittent over the past year or more.  She has had previous diagnosis of BV which has been treated.  She states she also has remote history of gonorrhea/chlamydia more than 8 years ago.  Patient is in a monogamous relationship.  Patient's primary care sent her to the emergency department with concerns over possible pelvic inflammatory disease   Review of Systems  Positive: Pelvic pain, discharge, nausea Negative: Vomiting  Physical Exam  BP (!) 156/101   Pulse (!) 101   Temp 98.7 F (37.1 C) (Oral)   Resp 16   LMP 01/14/2022 (Exact Date)   SpO2 98%  Gen:   Awake, no distress   Resp:  Normal effort  MSK:   Moves extremities without difficulty  Other:  Abdominal pain, lower abdomen  Medical Decision Making  Medically screening exam initiated at 12:10 PM.  Appropriate orders placed.  Rose Campos was informed that the remainder of the evaluation will be completed by another provider, this initial triage assessment does not replace that evaluation, and the importance of remaining in the ED until their evaluation is complete.     Dorothyann Peng, PA-C 02/06/22 1212

## 2022-02-06 NOTE — ED Provider Notes (Signed)
[facility] EMERGENCY DEPARTMENT Provider Note   CSN: [id number] Arrival date & time: 02/06/22  1153     History  Chief Complaint  Patient presents with   Pelvic Pain    Rose Campos is a 31 y.o. female. With past medical history of gastritis who presents to the emergency department with pelvic pain.  States she was [facility] for rule out of PID.  Patient states that she has had lower pelvic pain for about 3 weeks now.  States that as of the past week she has noticed some yellow-colored discharge from her vagina.  She states that the pelvic pain feels like "if somebody put a tampon too far up."  She is also having dyspareunia.  States that she has had BV multiple times and been treated for this.  She does not have concern over STD.  Last menstrual period was 01/14/2022.  Not on birth control.  Sexually active with 1 female partner.  She denies any urinary symptoms, nausea, vomiting or diarrhea.   Pelvic Pain       Home Medications Prior to Admission medications   Medication Sig Start Date End Date Taking? Authorizing Provider  benzonatate (TESSALON PERLES) 100 MG capsule Take 1 capsule (100 mg total) by mouth 3 (three) times daily as needed for cough. 06/22/21 06/22/22  Cuthriell, Charline Bills, PA-C  brompheniramine-pseudoephedrine-DM 30-2-10 MG/5ML syrup Take 10 mLs by mouth 4 (four) times daily as needed. 06/22/21   Cuthriell, Charline Bills, PA-C  famotidine (PEPCID) 40 MG tablet Take 1 tablet (40 mg total) by mouth every evening. 04/03/17 04/03/18  Loney Hering, MD  loperamide (IMODIUM A-D) 2 MG tablet Take 1 tablet (2 mg total) by mouth 4 (four) times daily as needed for diarrhea or loose stools. 06/22/21   Cuthriell, Charline Bills, PA-C  metoCLOPramide (REGLAN) 10 MG tablet Take 1 tablet (10 mg total) by mouth every 8 (eight) hours as needed. Patient not taking: Reported on 09/09/2018 04/03/17   Loney Hering, MD  ondansetron (ZOFRAN-ODT) 4  MG disintegrating tablet Dissolve 1 tablet (4 mg total) in the mouth every 8 (eight) hours as needed for nausea or vomiting. 06/22/21   Cuthriell, Charline Bills, PA-C  pantoprazole (PROTONIX) 40 MG tablet Take 1 tablet (40 mg total) by mouth daily. 06/22/21 06/22/22  Cuthriell, Charline Bills, PA-C  predniSONE (DELTASONE) 50 MG tablet Take 1 tablet (50 mg total) by mouth daily with breakfast. 06/22/21   Cuthriell, Charline Bills, PA-C  sucralfate (CARAFATE) 1 g tablet Take 1 tablet (1 g total) by mouth 4 (four) times daily. 06/22/21 06/22/22  Cuthriell, Charline Bills, PA-C      Allergies    Nickel    Review of Systems   Review of Systems  Genitourinary:  Positive for dyspareunia, pelvic pain and vaginal discharge.  All other systems reviewed and are negative.   Physical Exam Updated Vital Signs BP 123/84 (BP Location: Right Arm)   Pulse 78   Temp 98 F (36.7 C) (Oral)   Resp 17   LMP 01/14/2022 (Exact Date)   SpO2 98%  Physical Exam Vitals and nursing note reviewed. Exam conducted with a chaperone present.  Constitutional:      General: She is not in acute distress.    Appearance: Normal appearance. She is normal weight. She is not ill-appearing or toxic-appearing.  HENT:     Head: Normocephalic and atraumatic.     Nose: Nose normal.     Mouth/Throat:  Mouth: Mucous membranes are moist.     Pharynx: Oropharynx is clear.  Eyes:     General: No scleral icterus.    Extraocular Movements: Extraocular movements intact.     Pupils: Pupils are equal, round, and reactive to light.  Cardiovascular:     Rate and Rhythm: Normal rate and regular rhythm.     Pulses: Normal pulses.     Heart sounds: No murmur heard. Pulmonary:     Effort: Pulmonary effort is normal. No respiratory distress.     Breath sounds: Normal breath sounds.  Abdominal:     General: Bowel sounds are normal. There is no distension.     Palpations: Abdomen is soft.     Tenderness: There is no abdominal tenderness.   Genitourinary:    General: Normal vulva.     Exam position: Lithotomy position.     Vagina: Vaginal discharge present.     Cervix: Discharge present. No cervical motion tenderness or erythema.     Adnexa: Right adnexa normal.       Right: No tenderness.         Left: Tenderness present.      Comments: White discharge in the vaginal vault as well as in the cul-de-sac. Musculoskeletal:        General: Normal range of motion.     Cervical back: Neck supple.  Skin:    General: Skin is warm and dry.     Capillary Refill: Capillary refill takes less than 2 seconds.  Neurological:     General: No focal deficit present.     Mental Status: She is alert and oriented to person, place, and time. Mental status is at baseline.  Psychiatric:        Mood and Affect: Mood normal.        Behavior: Behavior normal.        Thought Content: Thought content normal.        Judgment: Judgment normal.    ED Results / Procedures / Treatments   Labs (all labs ordered are listed, but only abnormal results are displayed) Labs Reviewed  COMPREHENSIVE METABOLIC PANEL - Abnormal; Notable for the following components:      Result Value   CO2 19 (*)    AST 14 (*)    All other components within normal limits  URINALYSIS, ROUTINE W REFLEX MICROSCOPIC - Abnormal; Notable for the following components:   Leukocytes,Ua TRACE (*)    All other components within normal limits  WET PREP, GENITAL  CBC WITH DIFFERENTIAL/PLATELET  LIPASE, BLOOD  RPR  HIV ANTIBODY (ROUTINE TESTING W REFLEX)  I-STAT BETA HCG BLOOD, ED ([facility], [facility], [facility] ONLY)  GC/CHLAMYDIA PROBE AMP ([facility]) NOT AT [facility]   EKG None  Radiology US Pelvis Complete  Result Date: 02/06/2022 CLINICAL DATA:  possible PID/r/o other sources of pain EXAM: TRANSABDOMINAL AND TRANSVAGINAL ULTRASOUND OF PELVIS TECHNIQUE: Both transabdominal and transvaginal ultrasound examinations of the pelvis were performed. Transabdominal technique was performed for global  imaging of the pelvis including uterus, ovaries, adnexal regions, and pelvic cul-de-sac. It was necessary to proceed with endovaginal exam following the transabdominal exam to visualize the uterus, endometrium, bilateral ovaries. COMPARISON:  CT abdomen pelvis 09/17/2013, ultrasound pelvis 09/18/2013 FINDINGS: Uterus Measurements: 7.6 x 3.9 x 4.6 cm = volume: 71 mL. No fibroids or other mass visualized. Endometrium Thickness: 10 mm.  No focal abnormality visualized. Right ovary Measurements: 2.7 x 1.9 x 2.9 cm = volume: 8 mL. Normal appearance/no adnexal mass. Left ovary  Measurements: 4 x 2.2 x 2.3 cm = volume: 10.5 mL. Corpus luteum cyst noted within left ovary. Normal appearance/no adnexal mass. Other findings At least small volume free fluid within the pelvis. No tenderness on exam. IMPRESSION: 1. At least small volume free fluid within the pelvis. Finding may be physiologic in etiology. 2. Otherwise unremarkable pelvic ultrasound. Electronically Signed   By: Iven Finn M.D.   On: 02/06/2022 17:43   US Transvaginal Non-OB  Result Date: 02/06/2022 CLINICAL DATA:  possible PID/r/o other sources of pain EXAM: TRANSABDOMINAL AND TRANSVAGINAL ULTRASOUND OF PELVIS TECHNIQUE: Both transabdominal and transvaginal ultrasound examinations of the pelvis were performed. Transabdominal technique was performed for global imaging of the pelvis including uterus, ovaries, adnexal regions, and pelvic cul-de-sac. It was necessary to proceed with endovaginal exam following the transabdominal exam to visualize the uterus, endometrium, bilateral ovaries. COMPARISON:  CT abdomen pelvis 09/17/2013, ultrasound pelvis 09/18/2013 FINDINGS: Uterus Measurements: 7.6 x 3.9 x 4.6 cm = volume: 71 mL. No fibroids or other mass visualized. Endometrium Thickness: 10 mm.  No focal abnormality visualized. Right ovary Measurements: 2.7 x 1.9 x 2.9 cm = volume: 8 mL. Normal appearance/no adnexal mass. Left ovary Measurements: 4 x 2.2 x 2.3 cm  = volume: 10.5 mL. Corpus luteum cyst noted within left ovary. Normal appearance/no adnexal mass. Other findings At least small volume free fluid within the pelvis. No tenderness on exam. IMPRESSION: 1. At least small volume free fluid within the pelvis. Finding may be physiologic in etiology. 2. Otherwise unremarkable pelvic ultrasound. Electronically Signed   By: Iven Finn M.D.   On: 02/06/2022 17:43    Procedures Procedures   Medications Ordered in ED Medications  cefTRIAXone (ROCEPHIN) injection 500 mg (500 mg Intramuscular Given 02/06/22 1852)  lidocaine (PF) (XYLOCAINE) 1 % injection (1 mL  Given 02/06/22 1852)    ED Course/ Medical Decision Making/ A&P                           Medical Decision Making Amount and/or Complexity of Data Reviewed Labs: ordered. Radiology: ordered.  Risk Prescription drug management.  This patient presents to the ED with chief complaint(s) of pelvic pain with pertinent past medical history of gastritis which further complicates the presenting complaint. The complaint involves an extensive differential diagnosis and also carries with it a high risk of complications and morbidity.    The differential diagnosis includes PID, TOA, ovarian torsion, ectopic pregnancy, STD, UTI, nephrolithiasis, obstructed stone, infected stone, acute hepatobiliary disease, pancreatitis, appendicitis, PUD, gastritis, SBO, diverticulitis, colitis, viral gastroenteritis, Crohn's, UC, etc   The initial plan is to obtain basic labs including HIV and RPR and patient request, urine, lipase, pregnancy. Obtain US pelvic and pelvic exam.  Additional history obtained: Additional history obtained from  none available Records reviewed Care Everywhere/External Records and Primary Care Documents  Independent labs interpretation:  The following labs were independently interpreted:  CBC without leukocytosis, anemia CMP without electrolyte derangement, AKI, transaminitis  Lipase  negative  UA with trace leuks, no evidence of UTI Not pregnant, negative HIV  RPR, GC/chlamydia pending  Wet prep negative  Independent visualization of imaging: - I independently visualized the following imaging with scope of interpretation limited to determining acute life threatening conditions related to emergency care: Ultrasound pelvic, which revealed trace physiologic free fluid  Treatment and Reassessment: 31 year old female with 3-wk history of pelvic pain and vaginal discharge, dyspareunia.  Ultrasound is negative for acute findings  Pelvic  exam performed with chaperone present with moderate amount of white, thin discharge. She did have mild left adnexal tenderness Labs as above   Based on her HPI, PE, work up I have low suspicion for UTI, stone, infected stone, obstructed stone. Symptoms inconsistent with an acute appendicitis, diverticulitis, gastroenteritis. Doubt upper abdominal pathologies like pancreatitis or acute hepatobiliary disease. Symptoms inconsistent with a SBO or vascular catastrophe.   Wet prep resulted negative. She is not pregnant so doubt ectopic. Obtained an ultrasound to r/o torsion (although history is inconsistent) and TOA which were negative. We had a long discussion at bedside regarding treating her for PID. Discussed there is no strict criteria to make this diagnosis and will not have GC/chlamydia back today. She wishes to proceed with antibiotic therapy.  Given Ceftriaxone IM here in ED. Prescribed doxy and flagyl for 14 day course.  She is to have f/u with OBGYN for ongoing management of her symptoms and I have sent an ambulatory referral. Instructed to f/u on GC/chlamydia, RPR on MyChart. Return precautions for significantly worsening symptoms or begins to have fever. She is agreeable to plan.   Consultation: - Consulted or discussed management/test interpretation w/ external professional: N/A  Consideration for admission or further workup: do not feel  admission or further workup indicated at this time Social Determinants of health: no PCP - instructed to call and establish. She has insurance.  Final Clinical Impression(s) / ED Diagnoses Final diagnoses:  Pelvic pain in female    Rx / DC Orders ED Discharge Orders          Ordered    metroNIDAZOLE (FLAGYL) 500 MG tablet  2 times daily        02/06/22 1817    doxycycline (VIBRAMYCIN) 100 MG capsule  2 times daily        02/06/22 1817    Ambulatory referral to Obstetrics / Gynecology        02/06/22 1817              Mickie Hillier, PA-C 02/08/22 [id number]    Regan Lemming, MD 02/08/22 2210

## 2022-02-06 NOTE — Discharge Instructions (Addendum)
You were seen in the emergency department today with pelvic pain.  While you were here we did labs, ultrasound and a pelvic exam.  All of your work-up has been normal.  You will not receive the gonorrhea and Chlamydia testing back for about 48 hours and can follow-up with this in your MyChart.  We had a long discussion and you preferred to be treated for pelvic inflammatory disease.  We gave you a shot here in the emergency department.  I am placing you on 2 antibiotics that you will take twice a day for the next 14 days.  One of them is Flagyl.  You have been prescribed an antibiotic called metronidazole, also known as Flagyl. Some side effects of metronidazole you should be aware of are nausea and headache. Take the medication with food to minimize stomach upset. It is imperative that you do not drink alcohol while taking this antibiotic. Please take the medication as prescribed. Please take the medication until your antibiotic course is complete.  The other is doxycycline.  Please return to the emergency department for severely worsening symptoms.  I have sent a referral to OB/GYN.  Please call the [facility] in 3 days if you do not receive a call to make an appointment.  I have placed this number in your discharge paperwork.

## 2022-02-06 NOTE — ED Notes (Signed)
Pt off the unit for US.

## 2022-02-06 NOTE — ED Notes (Signed)
Pt provided discharge instructions and prescription information. Pt was given the opportunity to ask questions and questions were answered.   

## 2022-02-07 LAB — GC/CHLAMYDIA PROBE AMP (~~LOC~~) NOT AT ARMC
Chlamydia: NEGATIVE
Comment: NEGATIVE
Comment: NORMAL
Neisseria Gonorrhea: NEGATIVE

## 2022-02-07 LAB — RPR: RPR Ser Ql: NONREACTIVE

## 2022-02-23 ENCOUNTER — Other Ambulatory Visit: Payer: Self-pay

## 2022-02-25 ENCOUNTER — Other Ambulatory Visit: Payer: Self-pay

## 2022-02-25 ENCOUNTER — Emergency Department: Payer: PRIVATE HEALTH INSURANCE

## 2022-02-25 DIAGNOSIS — M791 Myalgia, unspecified site: Secondary | ICD-10-CM | POA: Insufficient documentation

## 2022-02-25 DIAGNOSIS — Z20822 Contact with and (suspected) exposure to covid-19: Secondary | ICD-10-CM | POA: Insufficient documentation

## 2022-02-25 DIAGNOSIS — R5381 Other malaise: Secondary | ICD-10-CM | POA: Insufficient documentation

## 2022-02-25 DIAGNOSIS — J069 Acute upper respiratory infection, unspecified: Secondary | ICD-10-CM | POA: Insufficient documentation

## 2022-02-25 DIAGNOSIS — R5383 Other fatigue: Secondary | ICD-10-CM | POA: Diagnosis not present

## 2022-02-25 DIAGNOSIS — B349 Viral infection, unspecified: Secondary | ICD-10-CM | POA: Diagnosis not present

## 2022-02-25 DIAGNOSIS — J029 Acute pharyngitis, unspecified: Secondary | ICD-10-CM | POA: Diagnosis present

## 2022-02-25 NOTE — ED Notes (Signed)
Strep and COVID swab collected and in lab

## 2022-02-25 NOTE — ED Triage Notes (Signed)
Reports primary concern is sore throat and sensation that tonsils are swollen. Reports fatigue and generalized body aches. Reports worse at night. Reports negative home COVID test today. Reports dry non productive cough. Reports subjective fevers at home.   Pt reports chest aching at base of throat. Denies radiation to other areas.

## 2022-02-26 ENCOUNTER — Emergency Department
Admission: EM | Admit: 2022-02-26 | Discharge: 2022-02-26 | Disposition: A | Discharge status: Home / Self Care | Payer: PRIVATE HEALTH INSURANCE | Attending: Emergency Medicine | Admitting: Emergency Medicine

## 2022-02-26 DIAGNOSIS — J069 Acute upper respiratory infection, unspecified: Secondary | ICD-10-CM

## 2022-02-26 DIAGNOSIS — J029 Acute pharyngitis, unspecified: Secondary | ICD-10-CM

## 2022-02-26 DIAGNOSIS — B349 Viral infection, unspecified: Secondary | ICD-10-CM

## 2022-02-26 LAB — COMPREHENSIVE METABOLIC PANEL
ALT: 9 U/L (ref 0–44)
AST: 12 U/L — ABNORMAL LOW (ref 15–41)
Albumin: 4.2 g/dL (ref 3.5–5.0)
Alkaline Phosphatase: 45 U/L (ref 38–126)
Anion gap: 8 (ref 5–15)
BUN: 10 mg/dL (ref 6–20)
CO2: 23 mmol/L (ref 22–32)
Calcium: 9.1 mg/dL (ref 8.9–10.3)
Chloride: 108 mmol/L (ref 98–111)
Creatinine, Ser: 0.63 mg/dL (ref 0.44–1.00)
GFR, Estimated: >60 mL/min (ref >60)
Glucose, Bld: 97 mg/dL (ref 70–99)
Potassium: 3.6 mmol/L (ref 3.5–5.1)
Sodium: 139 mmol/L (ref 135–145)
Total Bilirubin: 0.6 mg/dL (ref 0.3–1.2)
Total Protein: 7.2 g/dL (ref 6.5–8.1)

## 2022-02-26 LAB — GROUP A STREP BY PCR: Group A Strep by PCR: NOT DETECTED

## 2022-02-26 LAB — CBC WITH DIFFERENTIAL/PLATELET
Abs Immature Granulocytes: 0.01 10*3/uL (ref 0.00–0.07)
Basophils Absolute: 0.1 10*3/uL (ref 0.0–0.1)
Basophils Relative: 1 %
Eosinophils Absolute: 0.3 10*3/uL (ref 0.0–0.5)
Eosinophils Relative: 3 %
HCT: 37.4 % (ref 36.0–46.0)
Hemoglobin: 13.1 g/dL (ref 12.0–15.0)
Immature Granulocytes: 0 %
Lymphocytes Relative: 38 %
Lymphs Abs: 3.3 10*3/uL (ref 0.7–4.0)
MCH: 30.5 pg (ref 26.0–34.0)
MCHC: 35 g/dL (ref 30.0–36.0)
MCV: 87.2 fL (ref 80.0–100.0)
Monocytes Absolute: 0.6 10*3/uL (ref 0.1–1.0)
Monocytes Relative: 7 %
Neutro Abs: 4.5 10*3/uL (ref 1.7–7.7)
Neutrophils Relative %: 51 %
Platelets: 296 10*3/uL (ref 150–400)
RBC: 4.29 MIL/uL (ref 3.87–5.11)
RDW: 13.1 % (ref 11.5–15.5)
WBC: 8.8 10*3/uL (ref 4.0–10.5)
nRBC: 0 % (ref 0.0–0.2)

## 2022-02-26 LAB — RESP PANEL BY RT-PCR (FLU A&B, COVID) ARPGX2
Influenza A by PCR: NEGATIVE
Influenza B by PCR: NEGATIVE
SARS Coronavirus 2 by RT PCR: NEGATIVE

## 2022-02-26 LAB — TROPONIN I (HIGH SENSITIVITY): Troponin I (High Sensitivity): 2 ng/L (ref <18)

## 2022-02-26 NOTE — ED Notes (Signed)
Pt refusing to have second troponin done at this time. Pt stated, "I don't think it is anything cardiac related. I don't think I am having a heart attack." Pt informed that it is up to her and she has the right to refuse. Caryl Pina, RN made aware of pt decision.

## 2022-02-26 NOTE — Discharge Instructions (Addendum)
Please take Tylenol and ibuprofen/Advil for your pain.  It is safe to take them together, or to alternate them every few hours.  Take up to 1000mg of Tylenol at a time, up to 4 times per day.  Do not take more than 4000 mg of Tylenol in 24 hours.  For ibuprofen, take 400-600 mg, 3 - 4 times per day.  

## 2022-02-26 NOTE — ED Provider Notes (Signed)
[facility] Provider Note    Event Date/Time   First MD Initiated Contact with Patient 02/26/22 [phone]     (approximate)   History   Sore Throat   HPI  Rose Campos is a 31 y.o. female who presents to the ED for evaluation of Sore Throat   Patient presents to the ED for evaluation of 2 days of sore throat, fatigue and malaise, generalized myalgias, nonproductive cough, congestion and subjective chills at home.  No specific sick contacts.  No syncope.   Physical Exam   Triage Vital Signs: ED Triage Vitals  Enc Vitals Group     BP 02/25/22 2323 131/87     Pulse Rate 02/25/22 2323 87     Resp 02/25/22 2323 18     Temp 02/25/22 2323 98.3 F (36.8 C)     Temp Source 02/25/22 2323 Oral     SpO2 02/25/22 2323 96 %     Weight 02/25/22 2324 180 lb (81.6 kg)     Height 02/25/22 2324 '5\' 11"'$  (1.803 m)     Head Circumference --      Peak Flow --      Pain Score 02/25/22 2324 4     Pain Loc --      Pain Edu? --      Excl. in [place]? --     Most recent vital signs: Vitals:   02/25/22 2323 02/26/22 0212  BP: 131/87 (!) 135/92  Pulse: 87 88  Resp: 18 18  Temp: 98.3 F (36.8 C) 97.9 F (36.6 C)  SpO2: 96% 99%    General: Awake, no distress.  Nontoxic CV:  Good peripheral perfusion. RRR Resp:  Normal effort. CTAB Abd:  No distention.  Soft and benign throughout MSK:  No deformity noted.  Neuro:  No focal deficits appreciated. Other:     ED Results / Procedures / Treatments   Labs (all labs ordered are listed, but only abnormal results are displayed) Labs Reviewed  COMPREHENSIVE METABOLIC PANEL - Abnormal; Notable for the following components:      Result Value   AST 12 (*)    All other components within normal limits  GROUP A STREP BY PCR  RESP PANEL BY RT-PCR (FLU A&B, COVID) ARPGX2  CBC WITH DIFFERENTIAL/PLATELET  TROPONIN I (HIGH SENSITIVITY)  TROPONIN I (HIGH SENSITIVITY)    EKG Sinus rhythm with a rate of 86 bpm.  Normal  axis and intervals.  No evidence of acute ischemia.  RADIOLOGY CXR interpreted by me without evidence of acute cardiopulmonary pathology.  Official radiology report(s): DG Chest 2 View  Result Date: 02/25/2022 CLINICAL DATA:  Chest pain EXAM: CHEST - 2 VIEW COMPARISON:  Chest x-ray 06/22/2021 FINDINGS: The heart size and mediastinal contours are within normal limits. Both lungs are clear. The visualized skeletal structures are unremarkable. IMPRESSION: No active cardiopulmonary disease. Electronically Signed   By: Ronney Asters M.D.   On: 02/25/2022 23:52    PROCEDURES and INTERVENTIONS:  .1-3 Lead EKG Interpretation  Performed by: Vladimir Crofts, MD Authorized by: Vladimir Crofts, MD     Interpretation: normal     ECG rate:  86   ECG rate assessment: normal     Rhythm: sinus rhythm     Ectopy: none     Conduction: normal     Medications - No data to display   IMPRESSION / MDM / [place] / ED COURSE  I reviewed the triage vital signs and the nursing  notes.  Differential diagnosis includes, but is not limited to, viral syndrome, bronchitis, strep throat, peritonsillar abscess, sepsis or bacteremia.  Pneumonia.  {Patient presents with symptoms of an acute illness or injury that is potentially life-threatening.  31 year old female presents to the ED with a constellation of symptoms likely representing viral syndrome and suitable for outpatient management.  Looks well without signs of upper airway obstruction or toxicity.  Clear CXR, normal CBC and CMP.  Tested negative for strep, flu and COVID.  Negative troponin.  Nonischemic EKG.  Suspect a viral syndrome and that she will be suitable for outpatient management with OTC symptomatic measures.  We discussed return precautions.      FINAL CLINICAL IMPRESSION(S) / ED DIAGNOSES   Final diagnoses:  Viral pharyngitis  Viral URI with cough  Viral syndrome     Rx / DC Orders   ED Discharge Orders     None         Note:  This document was prepared using Dragon voice recognition software and may include unintentional dictation errors.   Vladimir Crofts, MD 02/26/22 [phone]

## 2022-03-05 ENCOUNTER — Ambulatory Visit
Admission: EM | Admit: 2022-03-05 | Discharge: 2022-03-05 | Disposition: A | Discharge status: Home / Self Care | Payer: PRIVATE HEALTH INSURANCE

## 2022-03-05 DIAGNOSIS — K219 Gastro-esophageal reflux disease without esophagitis: Secondary | ICD-10-CM | POA: Diagnosis not present

## 2022-03-05 DIAGNOSIS — J358 Other chronic diseases of tonsils and adenoids: Secondary | ICD-10-CM | POA: Diagnosis not present

## 2022-03-05 DIAGNOSIS — J309 Allergic rhinitis, unspecified: Secondary | ICD-10-CM

## 2022-03-05 MED ORDER — FAMOTIDINE 40 MG PO TABS: 40.0000 mg | ORAL_TABLET | Freq: Every day | ORAL | 2 refills | Status: DC

## 2022-03-05 MED ORDER — CHLORHEXIDINE GLUCONATE 0.12 % MT SOLN
15.0000 mL | Freq: Two times a day (BID) | OROMUCOSAL | 0 refills | Status: DC
Start: 2022-03-05 — End: 2022-11-29

## 2022-03-05 MED ORDER — FLUTICASONE PROPIONATE 50 MCG/ACT NA SUSP: 1.0000 | Freq: Every day | NASAL | 2 refills | Status: AC

## 2022-03-05 MED ORDER — LANSOPRAZOLE 30 MG PO CPDR: 30.0000 mg | DELAYED_RELEASE_CAPSULE | Freq: Every morning | ORAL | 2 refills | Status: DC

## 2022-03-05 NOTE — ED Provider Notes (Signed)
[facility]    CSN: [id number] Arrival date & time: 03/05/22  1011    HISTORY   Chief Complaint  Patient presents with   tonsil stones   HPI Rose Campos is a pleasant, 31 y.o. female who presents to urgent care today. Patient complains of swelling of her right tonsil that is painless, states she was sick a week ago and overall is feeling better.  Patient also reports a history of allergies, GERD.  Patient states she notes tonsil stone on her left tonsil which is gone now.  Patient denies cough, shortness of breath, body aches, chills, sinus pressure.  Endorses congestion, postnasal drip, halitosis.  Patient states she used to take Protonix for GERD but then read article that suggested Protonix causes polyps so she stopped taking it, adds that she has noticed her GERD has been worse recently and has not been taking anything else for GERD.  The history is provided by the patient.   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 No pertinent surgical history. OB History     Gravida  0   Para  0   Term  0   Preterm  0   AB  0   Living  0      SAB  0   IAB  0   Ectopic  0   Multiple  0   Live Births  0          Home Medications    Prior to Admission medications   Medication Sig Start Date End Date Taking? Authorizing Provider  benzonatate (TESSALON PERLES) 100 MG capsule Take 1 capsule (100 mg total) by mouth 3 (three) times daily as needed for cough. 06/22/21 06/22/22  Cuthriell, Charline Bills, PA-C  brompheniramine-pseudoephedrine-DM 30-2-10 MG/5ML syrup Take 10 mLs by mouth 4 (four) times daily as needed. 06/22/21   Cuthriell, Charline Bills, PA-C  famotidine (PEPCID) 40 MG tablet Take 1 tablet (40 mg total) by mouth every evening. 04/03/17  04/03/18  Loney Hering, MD  hydrOXYzine (ATARAX) 10 MG tablet SMARTSIG:1 Tablet(s) By Mouth 1-3 Times Daily PRN 01/16/22   [provider]  loperamide (IMODIUM A-D) 2 MG tablet Take 1 tablet (2 mg total) by mouth 4 (four) times daily as needed for diarrhea or loose stools. 06/22/21   Cuthriell, Charline Bills, PA-C  metoCLOPramide (REGLAN) 10 MG tablet Take 1 tablet (10 mg total) by mouth every 8 (eight) hours as needed. Patient not taking: Reported on 09/09/2018 04/03/17   Loney Hering, MD  ondansetron (ZOFRAN-ODT) 4 MG disintegrating tablet Dissolve 1 tablet (4 mg total) in the mouth every 8 (eight) hours as needed for nausea or vomiting. 06/22/21   Cuthriell, Charline Bills, PA-C  pantoprazole (PROTONIX) 40 MG tablet Take 1 tablet (40 mg total) by mouth daily. 06/22/21 06/22/22  Cuthriell, Charline Bills, PA-C  predniSONE (DELTASONE) 50 MG tablet Take 1 tablet (50 mg total) by mouth daily with breakfast. 06/22/21   Cuthriell, Charline Bills, PA-C  sucralfate (CARAFATE) 1 g tablet Take 1 tablet (1 g total) by mouth 4 (four) times daily. 06/22/21 06/22/22  Cuthriell, Charline Bills, PA-C    Family History Family History  Problem Relation Age of Onset   Breast cancer Maternal Grandmother  Social History Social History   Tobacco Use   Smoking status: Some Days    Years: 5.00    Types: Cigarettes   Smokeless tobacco: Never  Vaping Use   Vaping Use: Never used  Substance Use Topics   Alcohol use: Yes    Alcohol/week: 1.0 standard drink of alcohol    Types: 1 Glasses of wine per week    Comment: ocassionally   Drug use: No   Allergies   Nickel  Review of Systems Review of Systems Pertinent findings revealed after performing a 14 point review of systems has been noted in the history of present illness.  Physical Exam Triage Vital Signs ED Triage Vitals  Enc Vitals Group     BP 04/29/21 0827 (!) 147/82     Pulse Rate 04/29/21 0827 72     Resp 04/29/21 0827 18     Temp 04/29/21  0827 98.3 F (36.8 C)     Temp Source 04/29/21 0827 Oral     SpO2 04/29/21 0827 98 %     Weight --      Height --      Head Circumference --      Peak Flow --      Pain Score 04/29/21 0826 5     Pain Loc --      Pain Edu? --      Excl. in [place]? --   No data found.  Updated Vital Signs BP 116/68 (BP Location: Left Arm)   Pulse 82   Temp 99.4 F (37.4 C) (Oral)   Resp 16   LMP 02/12/2022   SpO2 99%   Physical Exam Vitals and nursing note reviewed.  Constitutional:      General: She is not in acute distress.    Appearance: Normal appearance. She is not ill-appearing.  HENT:     Head: Normocephalic and atraumatic.     Salivary Glands: Right salivary gland is not diffusely enlarged or tender. Left salivary gland is not diffusely enlarged or tender.     Right Ear: Ear canal and external ear normal. No drainage. No middle ear effusion. There is no impacted cerumen. Tympanic membrane is not erythematous or bulging.     Left Ear: Ear canal and external ear normal. No drainage.  No middle ear effusion. There is no impacted cerumen. Tympanic membrane is not erythematous or bulging.     Ears:     Comments: Bilateral TMs bulging with clear fluid    Nose: Rhinorrhea present. No nasal deformity, septal deviation, mucosal edema or congestion. Rhinorrhea is clear.     Right Turbinates: Swollen and pale.     Left Turbinates: Swollen and pale.     Right Sinus: No maxillary sinus tenderness or frontal sinus tenderness.     Left Sinus: No maxillary sinus tenderness or frontal sinus tenderness.     Mouth/Throat:     Lips: Pink. No lesions.     Mouth: Mucous membranes are moist. No oral lesions.     Pharynx: Uvula midline. No pharyngeal swelling, oropharyngeal exudate, posterior oropharyngeal erythema or uvula swelling.     Tonsils: No tonsillar exudate. 1+ on the right. 0 on the left.     Comments: No tonsil stones or exudate appreciated, mild swelling of left tonsil without significant  erythema. Eyes:     General: Lids are normal.        Right eye: No discharge.        Left eye: No discharge.  Extraocular Movements: Extraocular movements intact.     Conjunctiva/sclera: Conjunctivae normal.     Right eye: Right conjunctiva is not injected.     Left eye: Left conjunctiva is not injected.  Neck:     Trachea: Trachea and phonation normal.  Cardiovascular:     Rate and Rhythm: Normal rate and regular rhythm.     Pulses: Normal pulses.     Heart sounds: Normal heart sounds. No murmur heard.    No friction rub. No gallop.  Pulmonary:     Effort: Pulmonary effort is normal. No accessory muscle usage, prolonged expiration or respiratory distress.     Breath sounds: Normal breath sounds. No stridor, decreased air movement or transmitted upper airway sounds. No decreased breath sounds, wheezing, rhonchi or rales.  Chest:     Chest wall: No tenderness.  Abdominal:     General: Abdomen is flat. Bowel sounds are normal.     Palpations: Abdomen is soft.     Tenderness: There is no abdominal tenderness.  Musculoskeletal:        General: Normal range of motion.     Cervical back: Normal range of motion and neck supple. Normal range of motion.  Lymphadenopathy:     Cervical: No cervical adenopathy.  Skin:    General: Skin is warm and dry.     Findings: No erythema or rash.  Neurological:     General: No focal deficit present.     Mental Status: She is alert and oriented to person, place, and time.  Psychiatric:        Mood and Affect: Mood normal.        Behavior: Behavior normal.     Visual Acuity Right Eye Distance:   Left Eye Distance:   Bilateral Distance:    Right Eye Near:   Left Eye Near:    Bilateral Near:     UC Couse / Diagnostics / Procedures:     Radiology No results found.  Procedures Procedures (including critical care time) EKG  Pending results:  Labs Reviewed - No data to display  Medications Ordered in UC: Medications - No data to  display  UC Diagnoses / Final Clinical Impressions(s)   I have reviewed the triage vital signs and the nursing notes.  Pertinent labs & imaging results that were available during my care of the patient were reviewed by me and considered in my medical decision making (see chart for details).    Final diagnoses:  GERD without esophagitis  Allergic rhinitis, unspecified seasonality, unspecified trigger  Tonsillolith   Patient was advised of the etiology of tonsil stones which includes postnasal drip and GERD and since her GERD symptoms are not well controlled and she is not currently taking allergy medications, patient provided with a prescription for Prevacid, famotidine and Flonase.  Patient was also advised to begin gargling with chlorhexidine (Peridex) solution twice daily after brushing her teeth.  Patient advised to follow-up PCP regarding these issues.  ED Prescriptions     Medication Sig Dispense Auth. Provider   lansoprazole (PREVACID) 30 MG capsule Take 1 capsule (30 mg total) by mouth in the morning. 30 minutes prior to breakfast meal 30 capsule Lynden Oxford Scales, PA-C   chlorhexidine (PERIDEX) 0.12 % solution Use as directed 15 mLs in the mouth or throat 2 (two) times daily. 120 mL Lynden Oxford Scales, PA-C   fluticasone (FLONASE) 50 MCG/ACT nasal spray Place 1 spray into both nostrils daily. Begin by using 2 sprays in each  nare daily for 3 to 5 days, then decrease to 1 spray in each nare daily. 15.8 mL Lynden Oxford Scales, PA-C   famotidine (PEPCID) 40 MG tablet Take 1 tablet (40 mg total) by mouth at bedtime. 30 tablet Lynden Oxford Scales, PA-C      PDMP not reviewed this encounter.  Disposition Upon Discharge:  Condition: stable for discharge home Home: take medications as prescribed; routine discharge instructions as discussed; follow up as advised.  Patient presented with an acute illness with associated systemic symptoms and significant discomfort requiring  urgent management. In my opinion, this is a condition that a prudent lay person (someone who possesses an average knowledge of health and medicine) may potentially expect to result in complications if not addressed urgently such as respiratory distress, impairment of bodily function or dysfunction of bodily organs.   Routine symptom specific, illness specific and/or disease specific instructions were discussed with the patient and/or caregiver at length.   As such, the patient has been evaluated and assessed, work-up was performed and treatment was provided in alignment with urgent care protocols and evidence based medicine.  Patient/parent/caregiver has been advised that the patient may require follow up for further testing and treatment if the symptoms continue in spite of treatment, as clinically indicated and appropriate.  If the patient was tested for COVID-19, Influenza and/or RSV, then the patient/parent/guardian was advised to isolate at home pending the results of his/her diagnostic coronavirus test and potentially longer if they're positive. I have also advised pt that if his/her COVID-19 test returns positive, it's recommended to self-isolate for at least 10 days after symptoms first appeared AND until fever-free for 24 hours without fever reducer AND other symptoms have improved or resolved. Discussed self-isolation recommendations as well as instructions for household member/close contacts as per the [place] DHHS, and also gave patient the [place] packet with this information.  Patient/parent/caregiver has been advised to return to the [facility] or PCP in 3-5 days if no better; to PCP or the Emergency Department if new signs and symptoms develop, or if the current signs or symptoms continue to change or worsen for further workup, evaluation and treatment as clinically indicated and appropriate  The patient will follow up with their current PCP if and as advised. If the patient does not currently  have a PCP we will assist them in obtaining one.   The patient may need specialty follow up if the symptoms continue, in spite of conservative treatment and management, for further workup, evaluation, consultation and treatment as clinically indicated and appropriate.  Patient/parent/caregiver verbalized understanding and agreement of plan as discussed.  All questions were addressed during visit.  Please see discharge instructions below for further details of plan.  Discharge Instructions:   Discharge Instructions      For GERD symptoms, please begin Prevacid every day in the morning before your first meal and famotidine every night before bedtime.  The routine also doubles as an allergy medication which will be useful with your Flonase that I would like for you to use every day to prevent postnasal drip.  As you probably already know postnasal drip and GERD can cause tonsil stones.  Also for your tonsil stones I would like for you to to gargle with chlorhexidine solution before brushing twice daily.    Please follow-up with your primary care provider to discuss these recommendations and to request further refills.  Thank you for visiting urgent care today.  I appreciate the opportunity to participate  in your care.      This office note has been dictated using Museum/gallery curator.  Unfortunately, this method of dictation can sometimes lead to typographical or grammatical errors.  I apologize for your inconvenience in advance if this occurs.  Please do not hesitate to reach out to me if clarification is needed.      Lynden Oxford Scales, PA-C 03/05/22 1139

## 2022-03-05 NOTE — ED Triage Notes (Signed)
Pt c/o tonsil swelling and tonsil stones, the patient does not display or c/o SOB.  Started: last Saturday

## 2022-03-05 NOTE — Discharge Instructions (Signed)
For GERD symptoms, please begin Prevacid every day in the morning before your first meal and famotidine every night before bedtime.  The routine also doubles as an allergy medication which will be useful with your Flonase that I would like for you to use every day to prevent postnasal drip.  As you probably already know postnasal drip and GERD can cause tonsil stones.  Also for your tonsil stones I would like for you to to gargle with chlorhexidine solution before brushing twice daily.    Please follow-up with your primary care provider to discuss these recommendations and to request further refills.  Thank you for visiting urgent care today.  I appreciate the opportunity to participate in your care.

## 2022-11-23 ENCOUNTER — Ambulatory Visit (LOCAL_COMMUNITY_HEALTH_CENTER): Payer: Medicaid Other

## 2022-11-23 VITALS — BP 134/84 | Ht 71.0 in | Wt 186.0 lb

## 2022-11-23 DIAGNOSIS — Z3201 Encounter for pregnancy test, result positive: Secondary | ICD-10-CM

## 2022-11-23 DIAGNOSIS — Z309 Encounter for contraceptive management, unspecified: Secondary | ICD-10-CM | POA: Diagnosis not present

## 2022-11-23 DIAGNOSIS — Z3009 Encounter for other general counseling and advice on contraception: Secondary | ICD-10-CM

## 2022-11-23 LAB — PREGNANCY, URINE: Preg Test, Ur: POSITIVE — AB

## 2022-11-23 MED ORDER — PRENATAL VITAMINS 28-0.8 MG PO TABS: 28.0000 mg | ORAL_TABLET | Freq: Every day | ORAL | 0 refills | Status: AC

## 2022-11-23 NOTE — Progress Notes (Signed)
UPT positive.  Unsure where she would like to receive prenatal care.  EGA [redacted] weeks 2 days per LMP.  Denies any problems. Positive pregnancy packet given and reviewed with pt.  Discussed not smoking during pregnancy and literature given.    The patient was dispensed prenatal vitamins today. I provided counseling today regarding the medication. Patient given the opportunity to ask questions. Questions answered.    To clerk to discuss presumptive eligibility but state she may come back to complete.     Cherlynn Polo, RN

## 2022-11-28 ENCOUNTER — Telehealth: Payer: Self-pay | Admitting: Family Medicine

## 2022-11-28 NOTE — Telephone Encounter (Signed)
TC to patient who states she has lost her pregnancy symptoms. She had a  positive PT at [facility] on 11/23/22. Patient [facility]. Patient states she had some back pain on Saturday, and a very light pink streak on her toilet tissue when she wiped. Also she states she had a small clot, smaller than the size of a green pea. Patient has not been seen for a new OB here and counseled to go to the ED if she thinks she is miscarrying. Patient seems reluctant to go and wants to be seen in clinic. She states she knows her body and she thinks she needs a beta hcg. Patient counseled she can go the the ED and they may refer her here for Beta Hcgs if they think she is miscarrying. Patient states she feels like"everyone is giving me the run around". Burt Knack, RN

## 2022-11-28 NOTE — Telephone Encounter (Signed)
Patient called stating that she was just here for a preg test on 5/23 (+). She is having cramping and back pain and says she has lost her pregnancy symptoms. She called her obgyn and they said she must call here because she had the preg test here.

## 2022-11-29 ENCOUNTER — Encounter (HOSPITAL_COMMUNITY): Payer: Self-pay | Admitting: *Deleted

## 2022-11-29 ENCOUNTER — Inpatient Hospital Stay (HOSPITAL_COMMUNITY): Payer: Medicaid Other

## 2022-11-29 ENCOUNTER — Inpatient Hospital Stay (HOSPITAL_COMMUNITY)
Admission: AD | Admit: 2022-11-29 | Discharge: 2022-11-29 | Disposition: A | Discharge status: Home / Self Care | Payer: Medicaid Other | Attending: Obstetrics and Gynecology | Admitting: Obstetrics and Gynecology

## 2022-11-29 DIAGNOSIS — O200 Threatened abortion: Secondary | ICD-10-CM | POA: Diagnosis not present

## 2022-11-29 DIAGNOSIS — R109 Unspecified abdominal pain: Secondary | ICD-10-CM

## 2022-11-29 DIAGNOSIS — O99281 Endocrine, nutritional and metabolic diseases complicating pregnancy, first trimester: Secondary | ICD-10-CM | POA: Diagnosis not present

## 2022-11-29 DIAGNOSIS — N939 Abnormal uterine and vaginal bleeding, unspecified: Secondary | ICD-10-CM | POA: Diagnosis not present

## 2022-11-29 DIAGNOSIS — O26891 Other specified pregnancy related conditions, first trimester: Secondary | ICD-10-CM | POA: Diagnosis present

## 2022-11-29 DIAGNOSIS — Z3A01 Less than 8 weeks gestation of pregnancy: Secondary | ICD-10-CM | POA: Diagnosis not present

## 2022-11-29 HISTORY — DX: Urinary tract infection, site not specified: N39.0

## 2022-11-29 HISTORY — DX: Unspecified ovarian cyst, unspecified side: N83.209

## 2022-11-29 HISTORY — DX: Unspecified fracture of right toe(s), initial encounter for closed fracture: S92.911A

## 2022-11-29 LAB — HCG, QUANTITATIVE, PREGNANCY: hCG, Beta Chain, Quant, S: 12620 m[IU]/mL — ABNORMAL HIGH (ref <5)

## 2022-11-29 NOTE — MAU Provider Note (Signed)
History     CSN: [id number]  Arrival date and time: 11/29/22 1316   Event Date/Time   First Provider Initiated Contact with Patient 11/29/22 1428      Chief Complaint  Patient presents with   possible failed preg   Rose Campos , a  32 y.o. G2P0010 at [redacted]w[redacted]d presents to MAU with complaints of vaginal bleeding, on-going cramping and concern for miscarriage. Patient states that cramping began early last week and Friday progressively got worse. Patient states that Friday she rated pain 8/10 and it progressed to her lower back. She states that Friday she began having vaginal bleeding. She states she passed 1 clot and states that bleeding was minimal come Sunday. She states that on Sunday she went and had a [place] and was told that there was no YS only a GS. She states that since then all of her pregnancy symptoms have diminished, her cramping has spontaneously decreased to a 3-4/10 and her bleeding has stopped.   She is very concerned that she may have miscarried and is afraid that she is not being heard. Patient reports that she has spoke with [facility], her OBGYN office and a outside office in [place] and was told that there is nothing that they can do.          OB History     Gravida  2   Para  0   Term  0   Preterm  0   AB  1   Living  0      SAB  0   IAB  1   Ectopic  0   Multiple  0   Live Births  0           Past Medical History:  Diagnosis Date   Gastritis    Ovarian cyst    Polycystic ovary syndrome    Toe fracture, right    big toe   UTI (urinary tract infection)     Past Surgical History:  Procedure Laterality Date   COSMETIC SURGERY     lipo   denies surgery      Family History  Problem Relation Age of Onset   Lupus Mother    Atrial fibrillation Father    Breast cancer Maternal Grandmother     Social History   Tobacco Use   Smoking status: Former    Packs/day: 0.50    Years: 5.00    Additional pack years: 0.00    Total pack  years: 2.50    Types: Cigarettes   Smokeless tobacco: Never   Tobacco comments:    Quit with preg  Vaping Use   Vaping Use: Never used  Substance Use Topics   Alcohol use: Not Currently    Alcohol/week: 1.0 standard drink of alcohol    Types: 1 Glasses of wine per week    Comment: ocassionally; none since pregnant   Drug use: Not Currently    Comment: MJ use before pregnant per pt    Allergies:  Allergies  Allergen Reactions   Nickel Other (See Comments) and Rash    Medications Prior to Admission  Medication Sig Dispense Refill Last Dose   chlorhexidine (PERIDEX) 0.12 % solution Use as directed 15 mLs in the mouth or throat 2 (two) times daily. 120 mL 0    famotidine (PEPCID) 40 MG tablet Take 1 tablet (40 mg total) by mouth at bedtime. 30 tablet 2 More than a month   fluticasone (FLONASE) 50 MCG/ACT nasal spray  Place 1 spray into both nostrils daily. Begin by using 2 sprays in each nare daily for 3 to 5 days, then decrease to 1 spray in each nare daily. 15.8 mL 2 More than a month   hydrOXYzine (ATARAX) 10 MG tablet SMARTSIG:1 Tablet(s) By Mouth 1-3 Times Daily PRN      lansoprazole (PREVACID) 30 MG capsule Take 1 capsule (30 mg total) by mouth in the morning. 30 minutes prior to breakfast meal 30 capsule 2 More than a month   loperamide (IMODIUM A-D) 2 MG tablet Take 1 tablet (2 mg total) by mouth 4 (four) times daily as needed for diarrhea or loose stools. 30 tablet 0    Prenatal Vit-Fe Fumarate-FA (PRENATAL VITAMINS) 28-0.8 MG TABS Take 28 mg by mouth daily. 100 tablet 0 11/26/2022    Review of Systems  Constitutional:  Negative for chills, fatigue and fever.  Eyes:  Negative for pain and visual disturbance.  Respiratory:  Negative for apnea, shortness of breath and wheezing.   Cardiovascular:  Negative for chest pain and palpitations.  Gastrointestinal:  Positive for abdominal pain. Negative for constipation, diarrhea, nausea and vomiting.  Genitourinary:  Positive for  pelvic pain and vaginal bleeding. Negative for difficulty urinating, dysuria, vaginal discharge and vaginal pain.  Musculoskeletal:  Negative for back pain.  Neurological:  Negative for seizures, weakness and headaches.  Psychiatric/Behavioral:  Negative for suicidal ideas.    Physical Exam   Blood pressure 139/82, pulse 96, temperature 98.9 F (37.2 C), temperature source Oral, resp. rate 17, height 5\' 11"  (1.803 m), weight 84.1 kg, last menstrual period 10/17/2022, SpO2 100 %.  Physical Exam Vitals and nursing note reviewed.  Constitutional:      General: She is not in acute distress.    Appearance: Normal appearance.  HENT:     Head: Normocephalic.  Pulmonary:     Effort: Pulmonary effort is normal.  Musculoskeletal:     Cervical back: Normal range of motion.  Skin:    General: Skin is warm and dry.  Neurological:     Mental Status: She is alert and oriented to person, place, and time.  Psychiatric:        Mood and Affect: Mood normal.     MAU Course  Procedures Orders Placed This Encounter  Procedures   US OB LESS THAN 14 WEEKS WITH OB TRANSVAGINAL   hCG, quantitative, pregnancy   Diet NPO time specified   Results for orders placed or performed during the hospital encounter of 11/29/22 (from the past 24 hour(s))  hCG, quantitative, pregnancy     Status: Abnormal   Collection Time: 11/29/22  3:06 PM  Result Value Ref Range   hCG, Beta Chain, Quant, S 12,620 (H) <5 mIU/mL   US OB LESS THAN 14 WEEKS WITH OB TRANSVAGINAL  Result Date: 11/29/2022 CLINICAL DATA:  Vaginal bleeding. Cramping. Positive pregnancy test. EXAM: OBSTETRIC <14 WK [place] AND TRANSVAGINAL OB US TECHNIQUE: Both transabdominal and transvaginal ultrasound examinations were performed for complete evaluation of the gestation as well as the maternal uterus, adnexal regions, and pelvic cul-de-sac. Transvaginal technique was performed to assess early pregnancy. COMPARISON:  02/06/2022 FINDINGS: Transabdominal  images are very limited due to overlapping bowel gas and soft tissue and underdistended urinary bladder. Intrauterine gestational sac: Single Yolk sac:  Visualized. Embryo:  Not Visualized. Cardiac Activity: Not Visualized. No measurable fetal pole. MSD: 9.7 mm   5 w   5 d Subchorionic hemorrhage:  None visualized. Maternal uterus/adnexae: Retroverted uterus. Small amount  of free fluid in the dependent pelvis. Left ovary has small follicles and blood flow on color Doppler. The left ovary measures 2.7 x 3.2 by 2.9 cm. The right ovary appears similar measuring 2.3 x 2.3 by 2.3 cm. IMPRESSION: Intrauterine pregnancy with gestational sac and yolk sac. No fetal pole at this time. Estimated gestational age [redacted] weeks and 5 days by mean sac diameter. Small amount of free fluid in the pelvis. Limited transabdominal portion of the exam. Electronically Signed   By: Karen Kays M.D.   On: 11/29/2022 16:23    MDM - Sharene Butters 12,620  - US revealed a Gestational sac with a Yolk sac present measuring about [redacted]w[redacted]d.  - Threatened pregnancy versus viable pregnancy?  - Plan to repeat quant in 48 hours to trend.  - plan for discharge.   Assessment and Plan   1. Vaginal bleeding   2. Abdominal cramping   3. [redacted] weeks gestation of pregnancy   4. Threatened miscarriage    - Reviewed results of [place] in comparison to patient report and discussed that this may be a threatened miscarriage versus a miscarriage in progress.  - Patient verbalized understanding.  - Recommendation to repeat quant in 48 hours. Patient agreeable to plan of care.  - Patient scheduled for repeat Quant at [facility] on Friday at 10 am.  - Worsening signs and return precautions reviewed.  - Patient discharged home in stable condition and may return to MAU as needed.   Claudette Head, MSN CNM  11/29/2022, 2:28 PM

## 2022-11-29 NOTE — MAU Note (Signed)
Rose Campos is a 32 y.o. at [redacted]w[redacted]d here in MAU reporting: last wk she started having abd cramping, every day, Friday, it peaked really bad and started having lower back.   Passed a clot on Sat, faint red, no bleeding now and is no longer having any pregnancy symptoms, they abruptly stopped.  Had [place] [facility] this was first [place]- no yolk sac was found.  Not sure if she miscarried or is in the tube or what.  LMP: 4/16 Onset of complaint: last wk on Tues Pain score: none Vitals:   11/29/22 1401  BP: 139/82  Pulse: 96  Resp: 17  Temp: 98.9 F (37.2 C)  SpO2: 100%      Lab orders placed from triage:   Has had diarrhea everyday since Sat

## 2022-12-01 ENCOUNTER — Other Ambulatory Visit: Payer: Self-pay

## 2022-12-01 DIAGNOSIS — O200 Threatened abortion: Secondary | ICD-10-CM

## 2022-12-01 LAB — BETA HCG QUANT (REF LAB): hCG Quant: 11340 m[IU]/mL

## 2022-12-15 ENCOUNTER — Encounter: Payer: Self-pay | Admitting: Certified Nurse Midwife

## 2022-12-15 ENCOUNTER — Other Ambulatory Visit: Payer: Self-pay

## 2022-12-15 ENCOUNTER — Telehealth: Payer: Self-pay | Admitting: Certified Nurse Midwife

## 2022-12-15 ENCOUNTER — Ambulatory Visit: Payer: Medicaid Other | Admitting: Certified Nurse Midwife

## 2022-12-15 VITALS — BP 121/82 | HR 87 | Wt 180.6 lb

## 2022-12-15 DIAGNOSIS — Z3169 Encounter for other general counseling and advice on procreation: Secondary | ICD-10-CM | POA: Diagnosis not present

## 2022-12-15 DIAGNOSIS — Z319 Encounter for procreative management, unspecified: Secondary | ICD-10-CM

## 2022-12-15 DIAGNOSIS — O3680X0 Pregnancy with inconclusive fetal viability, not applicable or unspecified: Secondary | ICD-10-CM | POA: Diagnosis not present

## 2022-12-15 DIAGNOSIS — K2970 Gastritis, unspecified, without bleeding: Secondary | ICD-10-CM | POA: Insufficient documentation

## 2022-12-15 DIAGNOSIS — O039 Complete or unspecified spontaneous abortion without complication: Secondary | ICD-10-CM

## 2022-12-15 DIAGNOSIS — Z3201 Encounter for pregnancy test, result positive: Secondary | ICD-10-CM | POA: Diagnosis not present

## 2022-12-15 DIAGNOSIS — Z3A01 Less than 8 weeks gestation of pregnancy: Secondary | ICD-10-CM

## 2022-12-15 LAB — BETA HCG QUANT (REF LAB): hCG Quant: 75679 m[IU]/mL

## 2022-12-15 LAB — POCT PREGNANCY, URINE: Preg Test, Ur: POSITIVE — AB

## 2022-12-15 NOTE — Progress Notes (Signed)
   Subjective:   Patient Name: Rose Campos, female   DOB: 18-Nov-1990, 32 y.o.  MRN: [id number]  HPI Seen for follow up of SAB 2 weeks ago. Patient was seen in MAU 2weeks ago and noted several days of light spotting & bleeding and passing "one small clot." She reports that since then she has not had any bleeding, and denies passing clots. Denies bleeding, cramping. She does endorse nausea and vomiting. She also endorses re-onset of breast tenderness. Menses has not returned yet. Plans on becoming pregnant soon.     Review of Systems     Objective:   Physical Exam  Constitutional: She is oriented to person, place, and time. She appears well-developed and well-nourished.  Cardiovascular: Normal rate.  Abdominal: Soft. She exhibits no distension.  Neurological: She is alert and oriented to person, place, and time.  Skin: Skin is warm and dry.  Psychiatric: She has a normal mood and affect. Her behavior is normal. Judgment and thought content normal.      Component Ref Range & Units 2 wk ago (11/29/22)  hCG, Beta Chain, Quant, S <5 mIU/mL 12,620 High         Component Ref Range & Units 2 wk ago (12/01/22)  hCG Quant mIU/mL 11,340        Assessment & Plan:   1. SAB (spontaneous abortion)   2. Patient desires pregnancy   3. Encounter for preconception consultation   4. Pregnancy with uncertain fetal viability, single or unspecified fetus   - Patient counseled regarding future pregnancy plans and timing  Contraception: none at this time.  - Reviewed previous US and noted to have a gestational sac and yolk sac present.  - Repeat Quant collected today  - UPT positive on arrival today.   - Consulted Dr. Vergie Living on plan of care for patient given previous presentation, with no bleeding and continued pregnancy symptoms. Per MD schedule patient for a viability Korea next week. Reviewed plan of care and patient agrees with plan of care.   - Pt scheduled for viability Korea next Monday.     Claudette Head MSN, CNM 12/15/22  6:44 PM

## 2022-12-15 NOTE — Telephone Encounter (Signed)
Call placed to [facility] , a  32 y.o. G2P0010 to discuss STAT Beta Quant Results from todays visit. Patient confirmed using 2 patient identifiers.   Quant Results  Results for orders placed or performed in visit on 12/15/22 (from the past 24 hour(s))  Pregnancy, urine POC     Status: Abnormal   Collection Time: 12/15/22 10:40 AM  Result Value Ref Range   Preg Test, Ur POSITIVE (A) NEGATIVE  Beta hCG quant (ref lab)     Status: None   Collection Time: 12/15/22 10:59 AM  Result Value Ref Range   hCG Quant 75,679 mIU/mL   Narrative   Performed at:  01 - [facility] [address]  [address], [place], [place]  [id number] Lab Director: Mila Homer MD, Phone:  [phone]    Quant abnormally increased from previous visit 2 weeks ago where quant results were 11,340. Reviewed that normal viable pregnancies double quant numbers every 48 hours. While the quant has increased, still not appropriate numbers for viable pregnancy. Patient verbalized understanding.  Recommendation for patient to keep [place] appointment for Monday. Patient agreeable to plan of care    Rose Campos Danella Deis) Suzie Portela MSN, CNM  Center for [facility]  12/15/2022 5:15 PM

## 2022-12-17 IMAGING — CR DG HAND COMPLETE 3+V*R*
1 series · 3 of 3 positions shown · non-contrast
Comparison: None.

CLINICAL DATA: RIGHT hand pain from softball injury today. Initial
encounter.

EXAM:
RIGHT HAND - COMPLETE 3+ VIEW

[Series 1: dg hand complete right · 0.14mm/px · 3 of 3 slices shown]
[im 1/3]
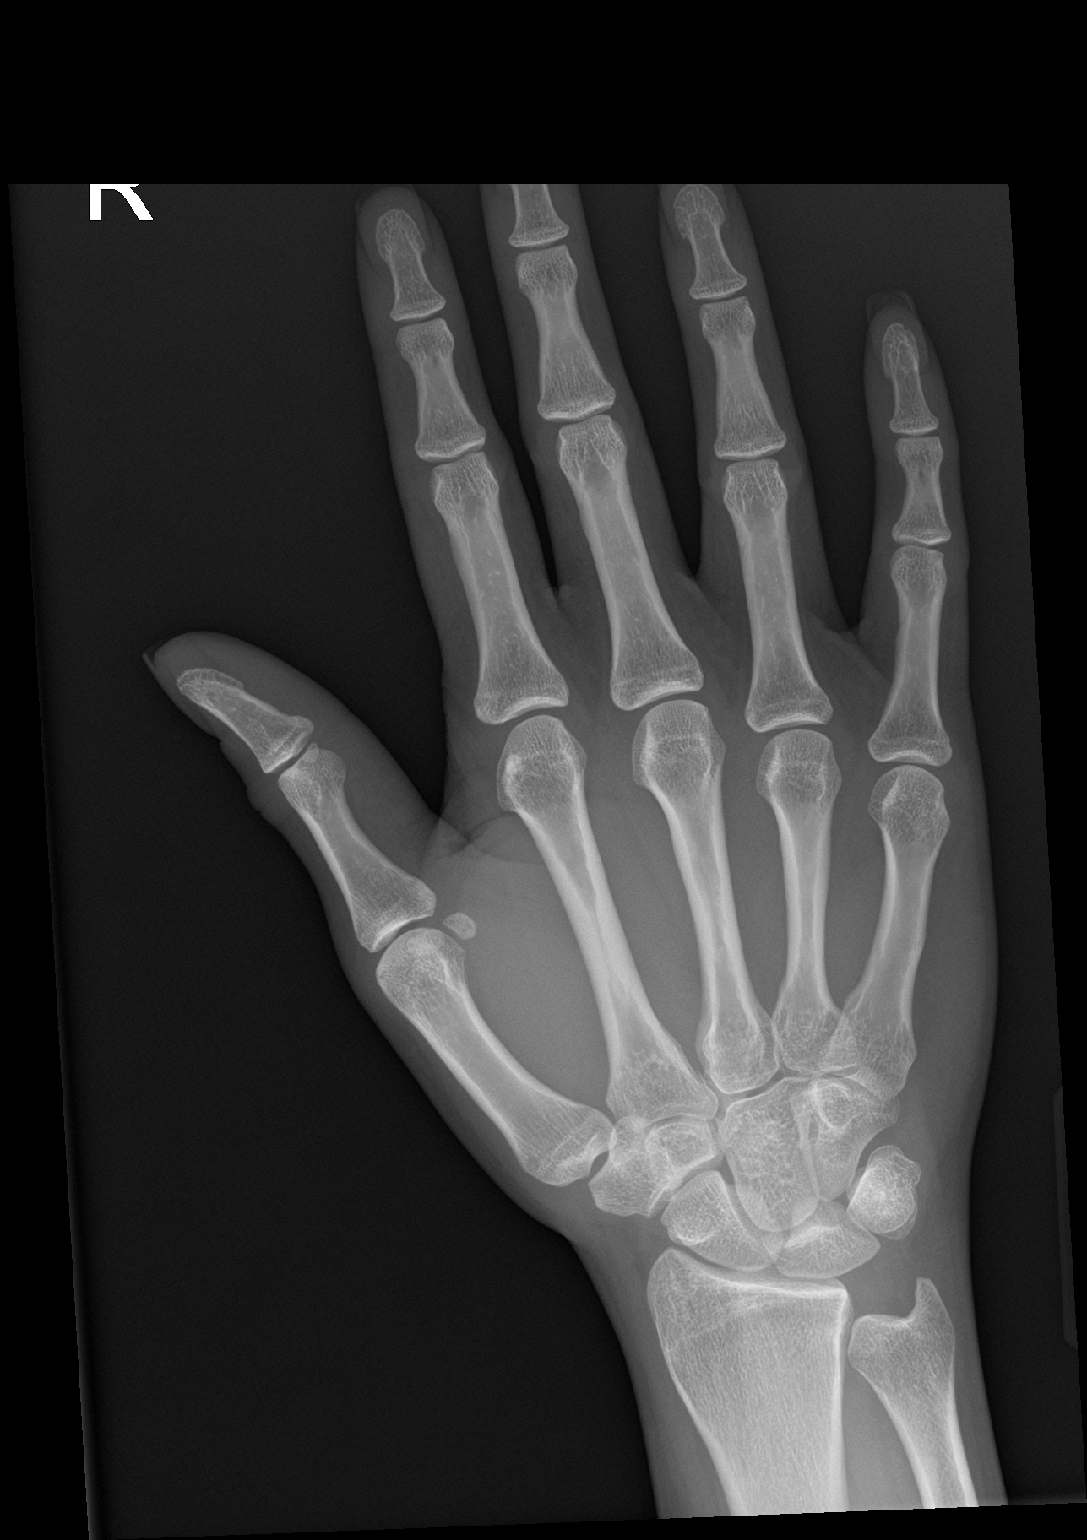
[im 2/3]
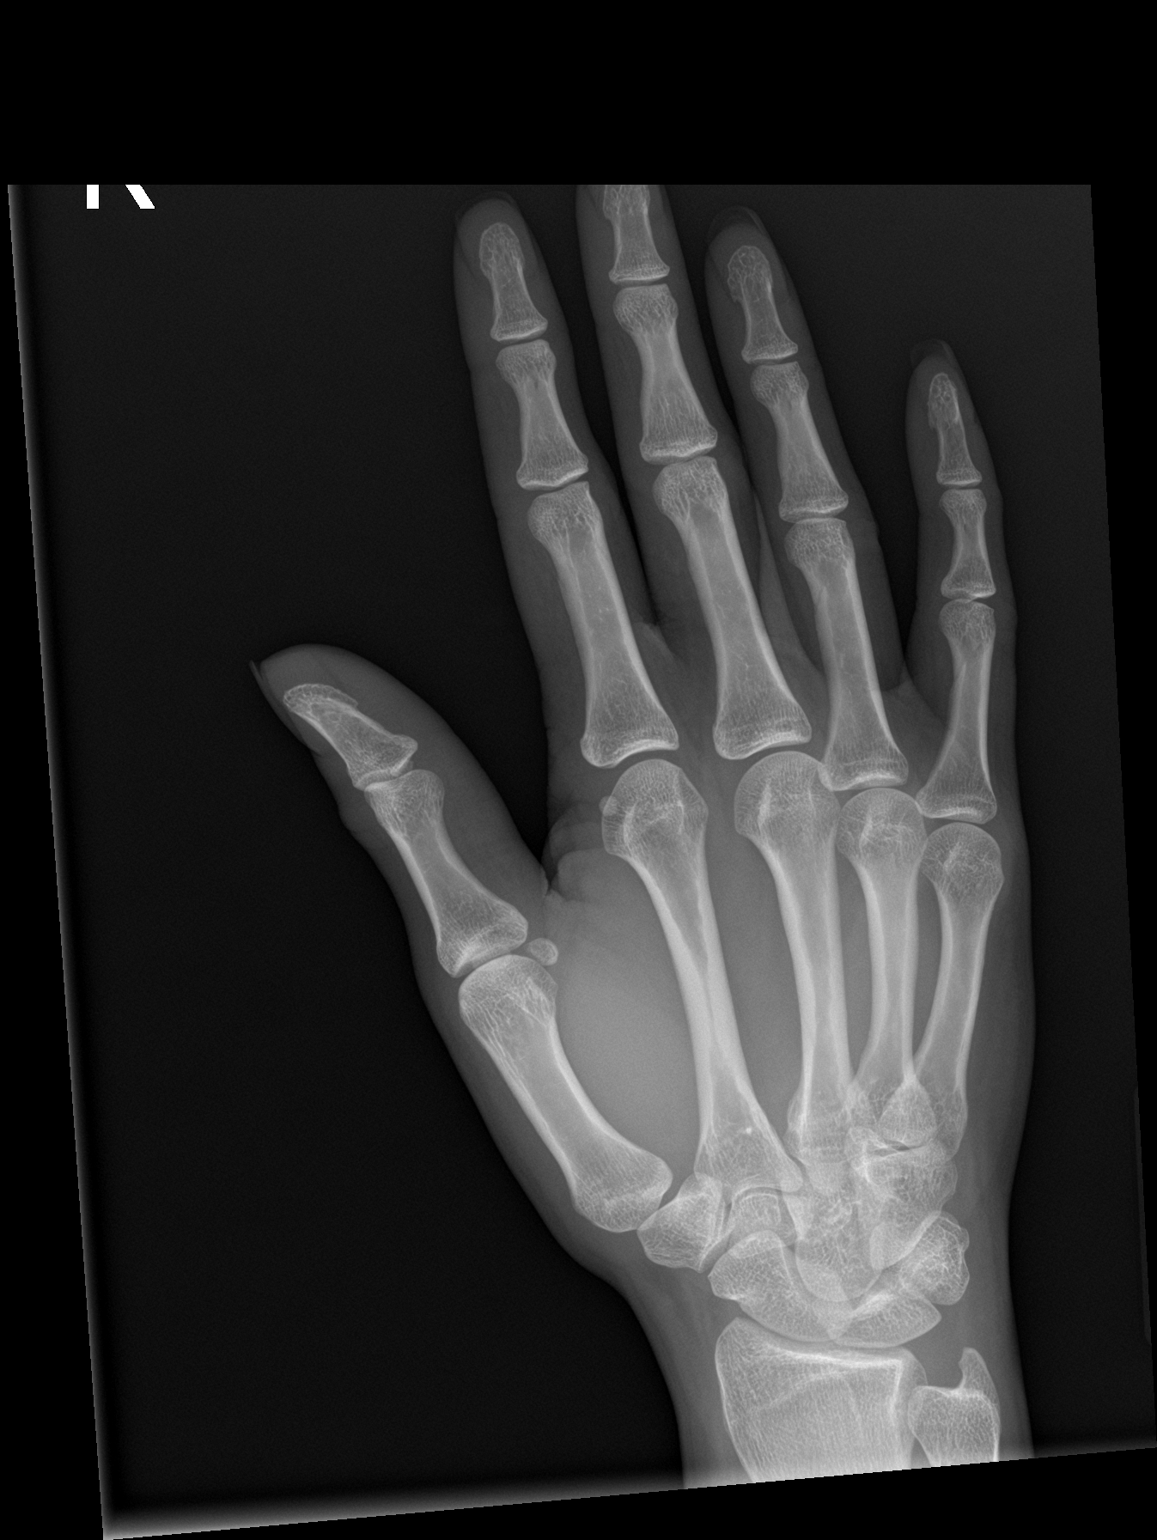
[im 3/3]
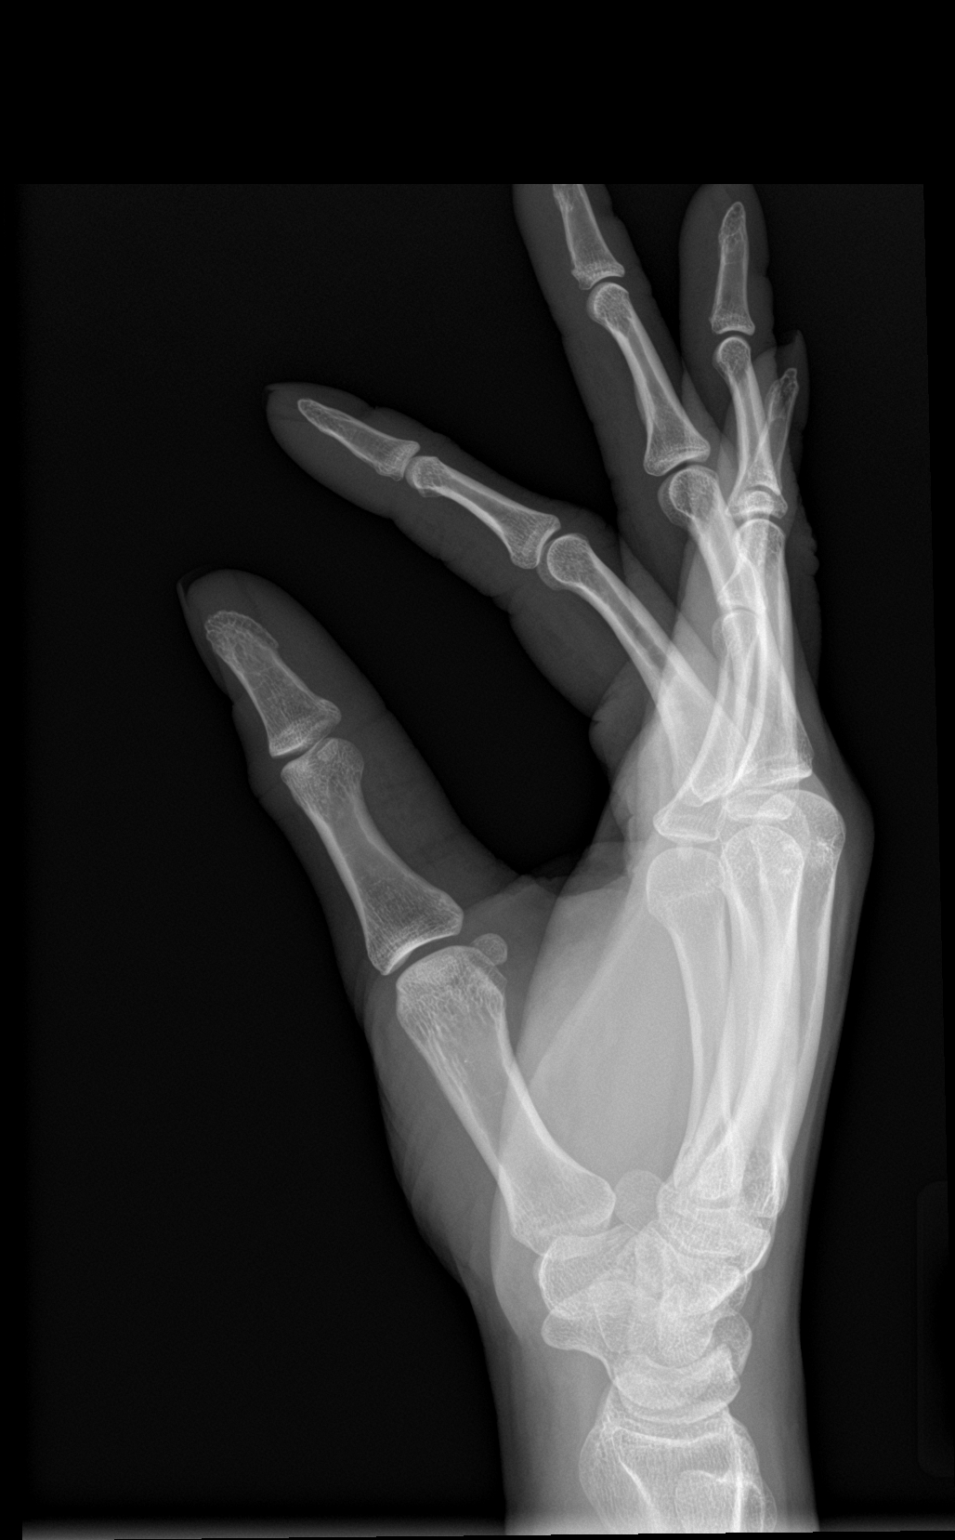

[3 of 3 positions shown; findings below may reference images not displayed]

FINDINGS: There is no evidence of fracture or dislocation. There is no
evidence of arthropathy or other focal bone abnormality. Soft
tissues are unremarkable.
IMPRESSION: Negative.

## 2022-12-18 ENCOUNTER — Other Ambulatory Visit: Payer: Self-pay

## 2022-12-18 ENCOUNTER — Ambulatory Visit (HOSPITAL_COMMUNITY)
Admission: RE | Admit: 2022-12-18 | Discharge: 2022-12-18 | Disposition: A | Discharge status: Home / Self Care | Payer: Medicaid Other | Source: Ambulatory Visit | Attending: Certified Nurse Midwife | Admitting: Certified Nurse Midwife

## 2022-12-18 DIAGNOSIS — O3680X0 Pregnancy with inconclusive fetal viability, not applicable or unspecified: Secondary | ICD-10-CM | POA: Diagnosis not present

## 2022-12-19 ENCOUNTER — Telehealth: Payer: Self-pay | Admitting: Certified Nurse Midwife

## 2022-12-19 ENCOUNTER — Encounter: Payer: Self-pay | Admitting: Certified Nurse Midwife

## 2022-12-19 DIAGNOSIS — Z3A08 8 weeks gestation of pregnancy: Secondary | ICD-10-CM

## 2022-12-19 DIAGNOSIS — Z3491 Encounter for supervision of normal pregnancy, unspecified, first trimester: Secondary | ICD-10-CM

## 2022-12-19 NOTE — Telephone Encounter (Signed)
Call placed to patient to review [place] results. Patient confirmed using 2 patient identifiers and CNM identified.   Results of US revealed a Single Living IUP measuring ~8weeks consistent with LMP  provided for patient.  - Patient in shock, but excited for pregnancy.  - Recommended to start taking a PNV and may use Zofran and Pepcid for nausea and vomiting.  - Message sent to [facility] to get patient set up for a NOB in the next few weeks.   Journei Thomassen Danella Deis) Suzie Portela, MSN, CNM  Center for [facility]  12/19/2022 12:40 AM

## 2022-12-20 ENCOUNTER — Other Ambulatory Visit: Payer: Self-pay

## 2022-12-21 ENCOUNTER — Ambulatory Visit: Payer: Self-pay | Admitting: Obstetrics and Gynecology

## 2022-12-26 ENCOUNTER — Ambulatory Visit: Payer: Medicaid Other

## 2022-12-27 ENCOUNTER — Ambulatory Visit (INDEPENDENT_AMBULATORY_CARE_PROVIDER_SITE_OTHER): Payer: Medicaid Other

## 2022-12-27 DIAGNOSIS — Z3480 Encounter for supervision of other normal pregnancy, unspecified trimester: Secondary | ICD-10-CM | POA: Insufficient documentation

## 2022-12-27 DIAGNOSIS — Z3689 Encounter for other specified antenatal screening: Secondary | ICD-10-CM

## 2022-12-27 NOTE — Progress Notes (Signed)
New OB Intake  I connected with  Despina Pole on 12/27/22 at  8:15 AM EDT by telephone and verified that I am speaking with the correct person using two identifiers. Nurse is located at [facility] and pt is located at home.  I explained I am completing New OB Intake today. We discussed her EDD of 07/24/2023 that is based on LMP of 10/17/2022. Pt is G2/P0010. I reviewed her allergies, medications, Medical/Surgical/OB history, and appropriate screenings. There are cats in the home. If yes Indoor.  Pt does not change litter box.  The cats are her mom's clients'. Based on history, this is a/an pregnancy uncomplicated .   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 Pt aware [facility] is the only hosp we deliver at.  Anatomy US Explained first scheduled [place] 6/17th and an anatomy scan will be done at 20 weeks. Labs Discussed genetic screening with patient. Patient desires genetic testing to be drawn at new OB visit. Discussed possible labs to be drawn at new OB appointment.  COVID Vaccine Patient has had COVID vaccine. Pt to bring her card for proper documentation.  Social Determinants of Health Food Insecurity: expresses food insecurity. Information given on local food banks. Transportation: Patient denies transportation needs.  First visit review I reviewed new OB appt with pt. I explained she will have ob bloodwork and pap smear/pelvic exam if indicated. Explained pt will be seen by an AOB provider at first visit; encounter routed to appropriate provider.   Loran Senters, [facility] 12/27/2022  8:46 AM

## 2022-12-27 NOTE — Progress Notes (Signed)
I contacted the patient via phone, she is scheduled for NOB with [facility] on 01/08/23

## 2022-12-27 NOTE — Patient Instructions (Signed)
Second Trimester of Pregnancy  The second trimester of pregnancy is from week 13 through week 27. This is months 4 through 6 of pregnancy. The second trimester is often a time when you feel your best. Your body has adjusted to being pregnant, and you begin to feel better physically. During the second trimester: Morning sickness has lessened or stopped completely. You may have more energy. You may have an increase in appetite. The second trimester is also a time when the unborn baby (fetus) is growing rapidly. At the end of the sixth month, the fetus may be up to 12 inches long and weigh about 1 pounds. You will likely begin to feel the baby move (quickening) between 16 and 20 weeks of pregnancy. Body changes during your second trimester Your body continues to go through many changes during your second trimester. The changes vary and generally return to normal after the baby is born. Physical changes Your weight will continue to increase. You will notice your lower abdomen bulging out. You may begin to get stretch marks on your hips, abdomen, and breasts. Your breasts will continue to grow and to become tender. Dark spots or blotches (chloasma or mask of pregnancy) may develop on your face. A dark line from your belly button to the pubic area (linea nigra) may appear. You may have changes in your hair. These can include thickening of your hair, rapid growth, and changes in texture. Some people also have hair loss during or after pregnancy, or hair that feels dry or thin. Health changes You may develop headaches. You may have heartburn. You may develop constipation. You may develop hemorrhoids or swollen, bulging veins (varicose veins). Your gums may bleed and may be sensitive to brushing and flossing. You may urinate more often because the fetus is pressing on your bladder. You may have back pain. This is caused by: Weight gain. Pregnancy hormones that are relaxing the joints in your  pelvis. A shift in weight and the muscles that support your balance. Follow these instructions at home: Medicines Follow your health care provider's instructions regarding medicine use. Specific medicines may be either safe or unsafe to take during pregnancy. Do not take any medicines unless approved by your health care provider. Take a prenatal vitamin that contains at least 600 micrograms (mcg) of folic acid. Eating and drinking Eat a healthy diet that includes fresh fruits and vegetables, whole grains, good sources of protein such as meat, eggs, or tofu, and low-fat dairy products. Avoid raw meat and unpasteurized juice, milk, and cheese. These carry germs that can harm you and your baby. You may need to take these actions to prevent or treat constipation: Drink enough fluid to keep your urine pale yellow. Eat foods that are high in fiber, such as beans, whole grains, and fresh fruits and vegetables. Limit foods that are high in fat and processed sugars, such as fried or sweet foods. Activity Exercise only as directed by your health care provider. Most people can continue their usual exercise routine during pregnancy. Try to exercise for 30 minutes at least 5 days a week. Stop exercising if you develop contractions in your uterus. Stop exercising if you develop pain or cramping in the lower abdomen or lower back. Avoid exercising if it is very hot or humid or if you are at a high altitude. Avoid heavy lifting. If you choose to, you may have sex unless your health care provider tells you not to. Relieving pain and discomfort Wear a supportive   bra to prevent discomfort from breast tenderness. Take warm sitz baths to soothe any pain or discomfort caused by hemorrhoids. Use hemorrhoid cream if your health care provider approves. Rest with your legs raised (elevated) if you have leg cramps or low back pain. If you develop varicose veins: Wear support hose as told by your health care  provider. Elevate your feet for 15 minutes, 3-4 times a day. Limit salt in your diet. Safety Wear your seat belt at all times when driving or riding in a car. Talk with your health care provider if someone is verbally or physically abusive to you. Lifestyle Do not use hot tubs, steam rooms, or saunas. Do not douche. Do not use tampons or scented sanitary pads. Avoid cat litter boxes and soil used by cats. These carry germs that can cause birth defects in the baby and possibly loss of the fetus by miscarriage or stillbirth. Do not use herbal remedies, alcohol, illegal drugs, or medicines that are not approved by your health care provider. Chemicals in these products can harm your baby. Do not use any products that contain nicotine or tobacco, such as cigarettes, e-cigarettes, and chewing tobacco. If you need help quitting, ask your health care provider. General instructions During a routine prenatal visit, your health care provider will do a physical exam and other tests. He or she will also discuss your overall health. Keep all follow-up visits. This is important. Ask your health care provider for a referral to a local prenatal education class. Ask for help if you have counseling or nutritional needs during pregnancy. Your health care provider can offer advice or refer you to specialists for help with various needs. Where to find more information American Pregnancy Association: americanpregnancy.org American College of Obstetricians and Gynecologists: acog.org/en/Womens%20Health/Pregnancy Office on Women's Health: womenshealth.gov/pregnancy Contact a health care provider if you have: A headache that does not go away when you take medicine. Vision changes or you see spots in front of your eyes. Mild pelvic cramps, pelvic pressure, or nagging pain in the abdominal area. Persistent nausea, vomiting, or diarrhea. A bad-smelling vaginal discharge or foul-smelling urine. Pain when you  urinate. Sudden or extreme swelling of your face, hands, ankles, feet, or legs. A fever. Get help right away if you: Have fluid leaking from your vagina. Have spotting or bleeding from your vagina. Have severe abdominal cramping or pain. Have difficulty breathing. Have chest pain. Have fainting spells. Have not felt your baby move for the time period told by your health care provider. Have new or increased pain, swelling, or redness in an arm or leg. Summary The second trimester of pregnancy is from week 13 through week 27 (months 4 through 6). Do not use herbal remedies, alcohol, illegal drugs, or medicines that are not approved by your health care provider. Chemicals in these products can harm your baby. Exercise only as directed by your health care provider. Most people can continue their usual exercise routine during pregnancy. Keep all follow-up visits. This is important. This information is not intended to replace advice given to you by your health care provider. Make sure you discuss any questions you have with your health care provider. Document Revised: 11/26/2019 Document Reviewed: 10/02/2019 Elsevier Patient Education  2024 [employer] First Trimester of Pregnancy  The first trimester of pregnancy starts on the first day of your last menstrual period until the end of week 12. This is also called months 1 through 3 of pregnancy. Body changes during your first trimester Your   body goes through many changes during pregnancy. The changes usually return to normal after your baby is born. Physical changes You may gain or lose weight. Your breasts may grow larger and hurt. The area around your nipples may get darker. Dark spots or blotches may develop on your face. You may have changes in your hair. Health changes You may feel like you might vomit (nauseous), and you may vomit. You may have heartburn. You may have headaches. You may have trouble pooping (constipation). Your  gums may bleed. Other changes You may get tired easily. You may pee (urinate) more often. Your menstrual periods will stop. You may not feel hungry. You may want to eat certain kinds of food. You may have changes in your emotions from day to day. You may have more dreams. Follow these instructions at home: Medicines Take over-the-counter and prescription medicines only as told by your doctor. Some medicines are not safe during pregnancy. Take a prenatal vitamin that contains at least 600 micrograms (mcg) of folic acid. Eating and drinking Eat healthy meals that include: Fresh fruits and vegetables. Whole grains. Good sources of protein, such as meat, eggs, or tofu. Low-fat dairy products. Avoid raw meat and unpasteurized juice, milk, and cheese. If you feel like you may vomit, or you vomit: Eat 4 or 5 small meals a day instead of 3 large meals. Try eating a few soda crackers. Drink liquids between meals instead of during meals. You may need to take these actions to prevent or treat trouble pooping: Drink enough fluids to keep your pee (urine) pale yellow. Eat foods that are high in fiber. These include beans, whole grains, and fresh fruits and vegetables. Limit foods that are high in fat and sugar. These include fried or sweet foods. Activity Exercise only as told by your doctor. Most people can do their usual exercise routine during pregnancy. Stop exercising if you have cramps or pain in your lower belly (abdomen) or low back. Do not exercise if it is too hot or too humid, or if you are in a place of great height (high altitude). Avoid heavy lifting. If you choose to, you may have sex unless your doctor tells you not to. Relieving pain and discomfort Wear a good support bra if your breasts are sore. Rest with your legs raised (elevated) if you have leg cramps or low back pain. If you have bulging veins (varicose veins) in your legs: Wear support hose as told by your  doctor. Raise your feet for 15 minutes, 3-4 times a day. Limit salt in your food. Safety Wear your seat belt at all times when you are in a car. Talk with your doctor if someone is hurting you or yelling at you. Talk with your doctor if you are feeling sad or have thoughts of hurting yourself. Lifestyle Do not use hot tubs, steam rooms, or saunas. Do not douche. Do not use tampons or scented sanitary pads. Do not use herbal medicines, illegal drugs, or medicines that are not approved by your doctor. Do not drink alcohol. Do not smoke or use any products that contain nicotine or tobacco. If you need help quitting, ask your doctor. Avoid cat litter boxes and soil that is used by cats. These carry germs that can cause harm to the baby and can cause a loss of your baby by miscarriage or stillbirth. General instructions Keep all follow-up visits. This is important. Ask for help if you need counseling or if you need help with   nutrition. Your doctor can give you advice or tell you where to go for help. Visit your dentist. At home, brush your teeth with a soft toothbrush. Floss gently. Write down your questions. Take them to your prenatal visits. Where to find more information American Pregnancy Association: americanpregnancy.org American College of Obstetricians and Gynecologists: www.acog.org Office on Women's Health: womenshealth.gov/pregnancy Contact a doctor if: You are dizzy. You have a fever. You have mild cramps or pressure in your lower belly. You have a nagging pain in your belly area. You continue to feel like you may vomit, you vomit, or you have watery poop (diarrhea) for 24 hours or longer. You have a bad-smelling fluid coming from your vagina. You have pain when you pee. You are exposed to a disease that spreads from person to person, such as chickenpox, measles, Zika virus, HIV, or hepatitis. Get help right away if: You have spotting or bleeding from your vagina. You have  very bad belly cramping or pain. You have shortness of breath or chest pain. You have any kind of injury, such as from a fall or a car crash. You have new or increased pain, swelling, or redness in an arm or leg. Summary The first trimester of pregnancy starts on the first day of your last menstrual period until the end of week 12 (months 1 through 3). Eat 4 or 5 small meals a day instead of 3 large meals. Do not smoke or use any products that contain nicotine or tobacco. If you need help quitting, ask your doctor. Keep all follow-up visits. This information is not intended to replace advice given to you by your health care provider. Make sure you discuss any questions you have with your health care provider. Document Revised: 11/26/2019 Document Reviewed: 10/02/2019 Elsevier Patient Education  2024 [employer]. Commonly Asked Questions During Pregnancy  Cats: A parasite can be excreted in cat feces.  To avoid exposure you need to have another person empty the little box.  If you must empty the litter box you will need to wear gloves.  Wash your hands after handling your cat.  This parasite can also be found in raw or undercooked meat so this should also be avoided.  Colds, Sore Throats, Flu: Please check your medication sheet to see what you can take for symptoms.  If your symptoms are unrelieved by these medications please call the office.  Dental Work: Most any dental work your dentist recommends is permitted.  X-rays should only be taken during the first trimester if absolutely necessary.  Your abdomen should be shielded with a lead apron during all x-rays.  Please notify your provider prior to receiving any x-rays.  Novocaine is fine; gas is not recommended.  If your dentist requires a note from us prior to dental work please call the office and we will provide one for you.  Exercise: Exercise is an important part of staying healthy during your pregnancy.  You may continue most exercises  you were accustomed to prior to pregnancy.  Later in your pregnancy you will most likely notice you have difficulty with activities requiring balance like riding a bicycle.  It is important that you listen to your body and avoid activities that put you at a higher risk of falling.  Adequate rest and staying well hydrated are a must!  If you have questions about the safety of specific activities ask your provider.    Exposure to Children with illness: Try to avoid obvious exposure; report any   symptoms to us when noted,  If you have chicken pos, red measles or mumps, you should be immune to these diseases.   Please do not take any vaccines while pregnant unless you have checked with your OB provider.  Fetal Movement: After 28 weeks we recommend you do "kick counts" twice daily.  Lie or sit down in a calm quiet environment and count your baby movements "kicks".  You should feel your baby at least 10 times per hour.  If you have not felt 10 kicks within the first hour get up, walk around and have something sweet to eat or drink then repeat for an additional hour.  If count remains less than 10 per hour notify your provider.  Fumigating: Follow your pest control agent's advice as to how long to stay out of your home.  Ventilate the area well before re-entering.  Hemorrhoids:   Most over-the-counter preparations can be used during pregnancy.  Check your medication to see what is safe to use.  It is important to use a stool softener or fiber in your diet and to drink lots of liquids.  If hemorrhoids seem to be getting worse please call the office.   Hot Tubs:  Hot tubs Jacuzzis and saunas are not recommended while pregnant.  These increase your internal body temperature and should be avoided.  Intercourse:  Sexual intercourse is safe during pregnancy as long as you are comfortable, unless otherwise advised by your provider.  Spotting may occur after intercourse; report any bright red bleeding that is heavier  than spotting.  Labor:  If you know that you are in labor, please go to the hospital.  If you are unsure, please call the office and let us help you decide what to do.  Lifting, straining, etc:  If your job requires heavy lifting or straining please check with your provider for any limitations.  Generally, you should not lift items heavier than that you can lift simply with your hands and arms (no back muscles)  Painting:  Paint fumes do not harm your pregnancy, but may make you ill and should be avoided if possible.  Latex or water based paints have less odor than oils.  Use adequate ventilation while painting.  Permanents & Hair Color:  Chemicals in hair dyes are not recommended as they cause increase hair dryness which can increase hair loss during pregnancy.  " Highlighting" and permanents are allowed.  Dye may be absorbed differently and permanents may not hold as well during pregnancy.  Sunbathing:  Use a sunscreen, as skin burns easily during pregnancy.  Drink plenty of fluids; avoid over heating.  Tanning Beds:  Because their possible side effects are still unknown, tanning beds are not recommended.  Ultrasound Scans:  Routine ultrasounds are performed at approximately 20 weeks.  You will be able to see your baby's general anatomy an if you would like to know the gender this can usually be determined as well.  If it is questionable when you conceived you may also receive an ultrasound early in your pregnancy for dating purposes.  Otherwise ultrasound exams are not routinely performed unless there is a medical necessity.  Although you can request a scan we ask that you pay for it when conducted because insurance does not cover " patient request" scans.  Work: If your pregnancy proceeds without complications you may work until your due date, unless your physician or employer advises otherwise.  Round Ligament Pain/Pelvic Discomfort:  Sharp, shooting pains not associated   with bleeding are  fairly common, usually occurring in the second trimester of pregnancy.  They tend to be worse when standing up or when you remain standing for long periods of time.  These are the result of pressure of certain pelvic ligaments called "round ligaments".  Rest, Tylenol and heat seem to be the most effective relief.  As the womb and fetus grow, they rise out of the pelvis and the discomfort improves.  Please notify the office if your pain seems different than that described.  It may represent a more serious condition.  Common Medications Safe in Pregnancy  Acne:      Constipation:  Benzoyl Peroxide     Colace  Clindamycin      Dulcolax Suppository  Topica Erythromycin     Fibercon  Salicylic Acid      Metamucil         Miralax AVOID:        Senakot   Accutane    Cough:  Retin-A       Cough Drops  Tetracycline      Phenergan w/ Codeine if Rx  Minocycline      Robitussin (Plain & DM)  Antibiotics:     Crabs/Lice:  Ceclor       RID  Cephalosporins    AVOID:  E-Mycins      Kwell  Keflex  Macrobid/Macrodantin   Diarrhea:  Penicillin      Kao-Pectate  Zithromax      Imodium AD         PUSH FLUIDS AVOID:       Cipro     Fever:  Tetracycline      Tylenol (Regular or Extra  Minocycline       Strength)  Levaquin      Extra Strength-Do not          Exceed 8 tabs/24 hrs Caffeine:        <200mg/day (equiv. To 1 cup of coffee or  approx. 3 12 oz sodas)         Gas: Cold/Hayfever:       Gas-X  Benadryl      Mylicon  Claritin       Phazyme  **Claritin-D        Chlor-Trimeton    Headaches:  Dimetapp      ASA-Free Excedrin  Drixoral-Non-Drowsy     Cold Compress  Mucinex (Guaifenasin)     Tylenol (Regular or Extra  Sudafed/Sudafed-12 Hour     Strength)  **Sudafed PE Pseudoephedrine   Tylenol Cold & Sinus     Vicks Vapor Rub  Zyrtec  **AVOID if Problems With Blood Pressure         Heartburn: Avoid lying down for at least 1 hour after meals  Aciphex      Maalox     Rash:  Milk of  Magnesia     Benadryl    Mylanta       1% Hydrocortisone Cream  Pepcid  Pepcid Complete   Sleep Aids:  Prevacid      Ambien   Prilosec       Benadryl  Rolaids       Chamomile Tea  Tums (Limit 4/day)     Unisom         Tylenol PM         Warm milk-add vanilla or  Hemorrhoids:       Sugar for taste  Anusol/Anusol H.C.  (RX: Analapram 2.5%)  Sugar Substitutes:    Hydrocortisone OTC     Ok in moderation  Preparation H      Tucks        Vaseline lotion applied to tissue with wiping    Herpes:     Throat:  Acyclovir      Oragel  Famvir  Valtrex     Vaccines:         Flu Shot Leg Cramps:       *Gardasil  Benadryl      Hepatitis A         Hepatitis B Nasal Spray:       Pneumovax  Saline Nasal Spray     Polio Booster         Tetanus Nausea:       Tuberculosis test or PPD  Vitamin B6 25 mg TID   AVOID:    Dramamine      *Gardasil  Emetrol       Live Poliovirus  Ginger Root 250 mg QID    MMR (measles, mumps &  High Complex Carbs @ Bedtime    rebella)  Sea Bands-Accupressure    Varicella (Chickenpox)  Unisom 1/2 tab TID     *No known complications           If received before Pain:         Known pregnancy;   Darvocet       Resume series after  Lortab        Delivery  Percocet    Yeast:   Tramadol      Femstat  Tylenol 3      Gyne-lotrimin  Ultram       Monistat  Vicodin           MISC:         All Sunscreens           Hair Coloring/highlights          Insect Repellant's          (Including DEET)         Mystic Tans  

## 2022-12-29 HISTORY — PX: DILATION AND CURETTAGE OF UTERUS: SHX78

## 2023-01-01 ENCOUNTER — Encounter: Payer: Self-pay | Admitting: General Practice

## 2023-01-01 ENCOUNTER — Other Ambulatory Visit: Payer: Self-pay

## 2023-01-01 ENCOUNTER — Ambulatory Visit (INDEPENDENT_AMBULATORY_CARE_PROVIDER_SITE_OTHER): Payer: Medicaid Other | Admitting: General Practice

## 2023-01-01 VITALS — BP 128/88 | HR 97 | Ht 71.0 in | Wt 176.0 lb

## 2023-01-01 DIAGNOSIS — Z6791 Unspecified blood type, Rh negative: Secondary | ICD-10-CM

## 2023-01-01 MED ORDER — RHO D IMMUNE GLOBULIN 1500 UNIT/2ML IJ SOSY
300.0000 ug | PREFILLED_SYRINGE | Freq: Once | INTRAMUSCULAR | Status: AC
  Administered 2023-01-01: 300 ug via INTRAMUSCULAR

## 2023-01-01 NOTE — Progress Notes (Signed)
Despina Pole here for  Rhogam   Injection.  Patient had an abortion with [facility] in [place] on 6/28 & was informed of negative blood type. Injection administered without complication. Patient will return as needed for care.  Marylynn Pearson, RN 01/01/2023  3:20 PM

## 2023-01-02 ENCOUNTER — Telehealth: Payer: Self-pay | Admitting: Obstetrics

## 2023-01-02 NOTE — Telephone Encounter (Signed)
Per Hoover Brunette A, Chart review this patient was seen at another facility no longer needs follow up with our clinic.

## 2023-01-02 NOTE — Telephone Encounter (Signed)
I contacted the patient via phone. She is needing an appointment this week for an nurse visit for an Rhogam  injection. I left message for the patient to call back to be scheduled.

## 2023-01-08 ENCOUNTER — Encounter: Payer: Medicaid Other | Admitting: Obstetrics

## 2023-01-16 ENCOUNTER — Other Ambulatory Visit: Payer: Self-pay | Admitting: Family

## 2023-01-17 ENCOUNTER — Encounter: Payer: Medicaid Other | Admitting: Obstetrics & Gynecology

## 2023-01-18 ENCOUNTER — Other Ambulatory Visit: Payer: Self-pay

## 2023-01-18 ENCOUNTER — Emergency Department
Admission: EM | Admit: 2023-01-18 | Discharge: 2023-01-18 | Disposition: A | Discharge status: Home / Self Care | Payer: Medicaid Other | Attending: Emergency Medicine | Admitting: Emergency Medicine

## 2023-01-18 ENCOUNTER — Emergency Department: Payer: Medicaid Other

## 2023-01-18 DIAGNOSIS — N939 Abnormal uterine and vaginal bleeding, unspecified: Secondary | ICD-10-CM | POA: Diagnosis not present

## 2023-01-18 DIAGNOSIS — M545 Low back pain, unspecified: Secondary | ICD-10-CM | POA: Diagnosis not present

## 2023-01-18 DIAGNOSIS — R9389 Abnormal findings on diagnostic imaging of other specified body structures: Secondary | ICD-10-CM

## 2023-01-18 DIAGNOSIS — N898 Other specified noninflammatory disorders of vagina: Secondary | ICD-10-CM | POA: Diagnosis not present

## 2023-01-18 DIAGNOSIS — R1030 Lower abdominal pain, unspecified: Secondary | ICD-10-CM | POA: Diagnosis present

## 2023-01-18 LAB — URINALYSIS, ROUTINE W REFLEX MICROSCOPIC
Bilirubin Urine: NEGATIVE
Glucose, UA: NEGATIVE mg/dL
Hgb urine dipstick: NEGATIVE
Ketones, ur: 5 mg/dL — AB
Leukocytes,Ua: NEGATIVE
Nitrite: NEGATIVE
Protein, ur: NEGATIVE mg/dL
Specific Gravity, Urine: 1.01 (ref 1.005–1.030)
pH: 6 (ref 5.0–8.0)

## 2023-01-18 LAB — COMPREHENSIVE METABOLIC PANEL
ALT: 8 U/L (ref 0–44)
AST: 12 U/L — ABNORMAL LOW (ref 15–41)
Albumin: 4.4 g/dL (ref 3.5–5.0)
Alkaline Phosphatase: 50 U/L (ref 38–126)
Anion gap: 7 (ref 5–15)
BUN: 6 mg/dL (ref 6–20)
CO2: 21 mmol/L — ABNORMAL LOW (ref 22–32)
Calcium: 8.8 mg/dL — ABNORMAL LOW (ref 8.9–10.3)
Chloride: 107 mmol/L (ref 98–111)
Creatinine, Ser: 0.68 mg/dL (ref 0.44–1.00)
GFR, Estimated: >60 mL/min (ref >60)
Glucose, Bld: 89 mg/dL (ref 70–99)
Potassium: 3.6 mmol/L (ref 3.5–5.1)
Sodium: 135 mmol/L (ref 135–145)
Total Bilirubin: 0.5 mg/dL (ref 0.3–1.2)
Total Protein: 7.2 g/dL (ref 6.5–8.1)

## 2023-01-18 LAB — CBC WITH DIFFERENTIAL/PLATELET
Abs Immature Granulocytes: 0.01 10*3/uL (ref 0.00–0.07)
Basophils Absolute: 0 10*3/uL (ref 0.0–0.1)
Basophils Relative: 1 %
Eosinophils Absolute: 0.1 10*3/uL (ref 0.0–0.5)
Eosinophils Relative: 2 %
HCT: 39.2 % (ref 36.0–46.0)
Hemoglobin: 14 g/dL (ref 12.0–15.0)
Immature Granulocytes: 0 %
Lymphocytes Relative: 29 %
Lymphs Abs: 1.9 10*3/uL (ref 0.7–4.0)
MCH: 30.6 pg (ref 26.0–34.0)
MCHC: 35.7 g/dL (ref 30.0–36.0)
MCV: 85.8 fL (ref 80.0–100.0)
Monocytes Absolute: 0.5 10*3/uL (ref 0.1–1.0)
Monocytes Relative: 8 %
Neutro Abs: 4 10*3/uL (ref 1.7–7.7)
Neutrophils Relative %: 60 %
Platelets: 291 10*3/uL (ref 150–400)
RBC: 4.57 MIL/uL (ref 3.87–5.11)
RDW: 13.6 % (ref 11.5–15.5)
WBC: 6.5 10*3/uL (ref 4.0–10.5)
nRBC: 0 % (ref 0.0–0.2)

## 2023-01-18 LAB — CHLAMYDIA/NGC RT PCR (ARMC ONLY)
Chlamydia Tr: NOT DETECTED
N gonorrhoeae: NOT DETECTED

## 2023-01-18 LAB — HCG, QUANTITATIVE, PREGNANCY: hCG, Beta Chain, Quant, S: 617 m[IU]/mL — ABNORMAL HIGH (ref <5)

## 2023-01-18 LAB — WET PREP, GENITAL
Clue Cells Wet Prep HPF POC: NONE SEEN
Sperm: NONE SEEN
Trich, Wet Prep: NONE SEEN
WBC, Wet Prep HPF POC: <10 (ref <10)
Yeast Wet Prep HPF POC: NONE SEEN

## 2023-01-18 NOTE — Consult Note (Cosign Needed Addendum)
Obstetrics & Gynecology Consult H&P   Consulting Department: OB GYN   Consulting Physician: Siri Cole, CNM   Consulting Question: Recent D and C complaints of discharge, US showed thickened endometrium.    History of Present Illness: Patient is a 32 y.o. G2P0020. Pt had a D and C three weeks ago in [place]. Around July 4th she noticed a foul vaginal odor with a yellowish discharge. She was also having some lower back pain and cramps. . Pt reports she had bleeding for 2-3 days after the D and C, it slowed down and then she had a day where the bleeding picked up, she had some cramping and then passed a few blood clots. The bleeding seemed to stopped, but today she is bleeding again, it is light in amount and red to brown-red. She last had IC in May. She went to an Urgent care today. She was diagnosed with BV. She asked if she could have PID, the urgent care sent her to the ED for evaluation.  Pt asked about Clomid. She took Clomid in January only. She has concerns about fertility and PCOS, she would like her Testerone checked in the future.   Mariko was seeing an OB provider in [place], but lost her insurance.   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     Dx:  10/06/2013    ALLERGIC RHINITIS DUE TO POLLEN 01/05/2009     Qualifier: Diagnosis of  By: Clydene Pugh CMA (AAMA), Dyane Dustman, HX OF 01/05/2009     Qualifier: Diagnosis of  By: Clydene Pugh CMA (AAMA), Heather       Past Surgical History:  Past Surgical History:  Procedure Laterality Date   COSMETIC SURGERY     lipo   denies surgery     WISDOM TOOTH EXTRACTION     four;  done at diff times throughout her 37s    Gynecologic History:   Obstetric History: G2P0020  Family History:  Family History  Problem Relation Age of Onset    Lupus Mother    Atrial fibrillation Father    Healthy Brother    Hyperlipidemia Maternal Grandmother    Hypertension Maternal Grandmother    Cancer Maternal Grandmother 52       uterine   Kidney disease Maternal Grandfather    Breast cancer Maternal Great-grandmother     Social History:  Social History   Socioeconomic History   Marital status: Single    Spouse name: Not on file   Number of children: 0   Years of education: 16   Highest education level: Bachelor's degree (e.g., BA, AB, BS)  Occupational History   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  Tobacco Use   Smoking status: Former    Current packs/day: 0.50    Average packs/day: 0.5 packs/day for 5.0 years (2.5 ttl pk-yrs)    Types: Cigarettes   Smokeless tobacco: Never   Tobacco comments:    Quit with preg  Vaping Use   Vaping status: Never Used  Substance and Sexual Activity   Alcohol use: Not Currently    Alcohol/week: 1.0 standard drink of alcohol    Types: 1 Glasses of wine per week    Comment: ocassionally; none since pregnant   Drug use: Not Currently    Comment: MJ use before  pregnant per pt   Sexual activity: Yes    Partners: Male    Birth control/protection: None    Comment: hx of patch, pill, and depo  Other Topics Concern   Not on file  Social History Narrative   Not on file   Social Determinants of Health   Financial Resource Strain: Medium Risk (12/27/2022)   Overall Financial Resource Strain (CARDIA)    Difficulty of Paying Living Expenses: Somewhat hard  Food Insecurity: Food Insecurity Present (12/27/2022)   Hunger Vital Sign    Worried About Running Out of Food in the Last Year: Never true    Ran Out of Food in the Last Year: Sometimes true  Transportation Needs: No Transportation Needs (12/27/2022)   PRAPARE - Administrator, Civil Service (Medical): No    Lack of Transportation (Non-Medical): No  Physical Activity: Sufficiently Active (12/27/2022)   Exercise Vital  Sign    Days of Exercise per Week: 7 days    Minutes of Exercise per Session: 30 min  Stress: Stress Concern Present (12/27/2022)   [employer] of Occupational Health - Occupational Stress Questionnaire    Feeling of Stress : To some extent  Social Connections: Unknown (12/27/2022)   Social Connection and Isolation Panel [NHANES]    Frequency of Communication with Friends and Family: Three times a week    Frequency of Social Gatherings with Friends and Family: Once a week    Attends Religious Services: Never    Database administrator or Organizations: No    Attends Banker Meetings: Never    Marital Status: Not on file  Intimate Partner Violence: Not At Risk (12/27/2022)   Humiliation, Afraid, Rape, and Kick questionnaire    Fear of Current or Ex-Partner: No    Emotionally Abused: No    Physically Abused: No    Sexually Abused: No    Allergies:  Allergies  Allergen Reactions   Nickel Other (See Comments) and Rash    Medications: Prior to Admission medications   Medication Sig Start Date End Date Taking? Authorizing Provider  fluticasone (FLONASE) 50 MCG/ACT nasal spray Place 1 spray into both nostrils daily. Begin by using 2 sprays in each nare daily for 3 to 5 days, then decrease to 1 spray in each nare daily. Patient not taking: Reported on 12/15/2022 03/05/22   Theadora Rama Scales, PA-C  hydrOXYzine (ATARAX) 10 MG tablet TAKE 1 TABLET BY MOUTH UP TO THREE TIMES DAILY AS NEEDED 01/18/23   Miki Kins, FNP  ondansetron (ZOFRAN-ODT) 4 MG disintegrating tablet Take by mouth. 04/02/17   [provider]  Prenatal Vit-Fe Fumarate-FA (PRENATAL VITAMINS) 28-0.8 MG TABS Take 28 mg by mouth daily. 11/23/22 03/03/23  Federico Flake, MD  valACYclovir (VALTREX) 1000 MG tablet Take by mouth. 07/18/22   [provider]    Physical Exam Vitals: Blood pressure (!) 134/95, pulse 93, temperature 98.8 F (37.1 C), resp. rate 16, height 5\' 11"  (1.803  m), weight 79 kg, last menstrual period 10/17/2022, SpO2 99%, unknown if currently breastfeeding. General: NAD HEENT: normocephalic, anicteric Pulmonary: No increased work of breathing Abdomen: flat, non tender  Genitourinary: Pt decline exam as she already had a speculum exam, bimanual and transvaginal exam. Pt reports the bimanual exam was not too painfulpelvic exam non concerning for PID per ED provider. Lauren Doughtery PA Extremities: no edema, erythema, or tenderness Neurologic: Grossly intact Psychiatric: mood appropriate, affect full  Labs: Results for orders placed or performed during  the hospital encounter of 01/18/23 (from the past 72 hour(s))  Comprehensive metabolic panel     Status: Abnormal   Collection Time: 01/18/23 12:29 PM  Result Value Ref Range   Sodium 135 135 - 145 mmol/L   Potassium 3.6 3.5 - 5.1 mmol/L   Chloride 107 98 - 111 mmol/L   CO2 21 (L) 22 - 32 mmol/L   Glucose, Bld 89 70 - 99 mg/dL    Comment: Glucose reference range applies only to samples taken after fasting for at least 8 hours.   BUN 6 6 - 20 mg/dL   Creatinine, Ser 0.34 0.44 - 1.00 mg/dL   Calcium 8.8 (L) 8.9 - 10.3 mg/dL   Total Protein 7.2 6.5 - 8.1 g/dL   Albumin 4.4 3.5 - 5.0 g/dL   AST 12 (L) 15 - 41 U/L   ALT 8 0 - 44 U/L   Alkaline Phosphatase 50 38 - 126 U/L   Total Bilirubin 0.5 0.3 - 1.2 mg/dL   GFR, Estimated >74 >25 mL/min    Comment: (NOTE) Calculated using the CKD-EPI Creatinine Equation (2021)    Anion gap 7 5 - 15    Comment: Performed at [facility], [address] [place], [place] [address]  CBC with Differential     Status: None   Collection Time: 01/18/23 12:29 PM  Result Value Ref Range   WBC 6.5 4.0 - 10.5 K/uL   RBC 4.57 3.87 - 5.11 MIL/uL   Hemoglobin 14.0 12.0 - 15.0 g/dL   HCT 75.6 43.3 - 29.5 %   MCV 85.8 80.0 - 100.0 fL   MCH 30.6 26.0 - 34.0 pg   MCHC 35.7 30.0 - 36.0 g/dL   RDW 18.8 41.6 - 60.6 %   Platelets 291 150 - 400 K/uL   nRBC 0.0  0.0 - 0.2 %   Neutrophils Relative % 60 %   Neutro Abs 4.0 1.7 - 7.7 K/uL   Lymphocytes Relative 29 %   Lymphs Abs 1.9 0.7 - 4.0 K/uL   Monocytes Relative 8 %   Monocytes Absolute 0.5 0.1 - 1.0 K/uL   Eosinophils Relative 2 %   Eosinophils Absolute 0.1 0.0 - 0.5 K/uL   Basophils Relative 1 %   Basophils Absolute 0.0 0.0 - 0.1 K/uL   Immature Granulocytes 0 %   Abs Immature Granulocytes 0.01 0.00 - 0.07 K/uL    Comment: Performed at [facility], [address] [place], [place] [address]  hCG, quantitative, pregnancy     Status: Abnormal   Collection Time: 01/18/23 12:29 PM  Result Value Ref Range   hCG, Beta Chain, Quant, S 617 (H) <5 mIU/mL    Comment:          GEST. AGE      CONC.  (mIU/mL)   <=1 WEEK        5 - 50     2 WEEKS       50 - 500     3 WEEKS       100 - 10,000     4 WEEKS     1,000 - 30,000     5 WEEKS     3,500 - 115,000   6-8 WEEKS     12,000 - 270,000    12 WEEKS     15,000 - 220,000        FEMALE AND NON-PREGNANT FEMALE:     LESS THAN 5 mIU/mL Performed at [facility], [address] [place], [place] [address]   Wet prep, genital     Status: None   Collection Time: 01/18/23  4:04 PM  Result Value Ref Range   Yeast Wet Prep HPF POC NONE SEEN NONE SEEN    Comment: Specimen diluted due to transport tube containing more than 1 ml of saline, interpret results with caution.   Trich, Wet Prep NONE SEEN NONE SEEN   Clue Cells Wet Prep HPF POC NONE SEEN NONE SEEN   WBC, Wet Prep HPF POC <10 <10   Sperm NONE SEEN     Comment: Performed at [facility], [address] [place], [place] [address]  Chlamydia/NGC rt PCR [facility] only)     Status: None   Collection Time: 01/18/23  4:04 PM   Specimen: Cervical/Vaginal swab  Result Value Ref Range   Specimen source GC/Chlam ENDOCERVICAL    Chlamydia Tr NOT DETECTED NOT DETECTED   N gonorrhoeae NOT DETECTED NOT DETECTED    Comment: (NOTE) This CT/NG assay has not been evaluated in patients  with a history of  hysterectomy. Performed at [facility], [address] [place], [place] [address]     Imaging US PELVIC COMPLETE W TRANSVAGINAL AND TORSION R/O  Result Date: 01/18/2023 CLINICAL DATA:  Pelvic pain after dilatation and curettage 3 weeks ago. EXAM: TRANSABDOMINAL AND TRANSVAGINAL ULTRASOUND OF PELVIS DOPPLER ULTRASOUND OF OVARIES TECHNIQUE: Both transabdominal and transvaginal ultrasound examinations of the pelvis were performed. Transabdominal technique was performed for global imaging of the pelvis including uterus, ovaries, adnexal regions, and pelvic cul-de-sac. It was necessary to proceed with endovaginal exam following the transabdominal exam to visualize the endometrium and ovaries. Color and duplex Doppler ultrasound was utilized to evaluate blood flow to the ovaries. COMPARISON:  December 18, 2022. FINDINGS: Uterus Measurements: 6.0 x 5.4 x 4.5 cm = volume: 78 mL. No fibroids or other mass visualized. Endometrium Thickness: 16 mm. Increased flow is noted on color Doppler suggesting retained products of conception. Right ovary Measurements: 3.0 x 2.3 x 2.3 cm = volume: 9 mL. Normal appearance/no adnexal mass. Left ovary Measurements: 4.4 x 2.6 x 2.5 cm = volume: 15 mL. Normal appearance/no adnexal mass. Pulsed Doppler evaluation of both ovaries demonstrates normal low-resistance arterial and venous waveforms. Other findings Trace free fluid is noted which most likely is physiologic. IMPRESSION: Thickened endometrium is noted with increased flow on color Doppler suggesting retained products of conception. No evidence of ovarian mass or torsion. Electronically Signed   By: Lupita Raider M.D.   On: 01/18/2023 15:57    Assessment: 32 y.o. G2P0020 S/p D and C 3 wks ago   Plan: -Reviewed Labs and [place] with Dr Logan Bores. Findings WNL s/p D and C -Reviewed with pt above information, the body takes time to register it is no longer pregnant after a miscarriage/d and c. The  bleeding you are experiencing is normal. Today's [place] was normal. We do need to follow your hormone level to less than 5 -Reviewed nature of BV. She may consider vaginal probiotics or a course of boric acid after the Flagyl if she desires.  -Contraception offered to control bleeding, pt declined, she does not intend to use hormones.  -PT to fu in 2 weeks in the office for an in person visit and  labs.   Total time spent including reviewing chart and speaking with patient 30 minutes.   Carie Caddy, CNM   [facility]  01/18/2023 6:14 PM

## 2023-01-18 NOTE — ED Triage Notes (Signed)
Pt to ED for yellow discharge after having D&C 3 weeks ago. Also reports pelvic and lower back pain. States was started on antibiotics for BV on 7/16

## 2023-01-18 NOTE — ED Provider Notes (Signed)
[facility] Provider Note    Event Date/Time   First MD Initiated Contact with Patient 01/18/23 1129     (approximate)   History   Post-op Problem   HPI  Rose Campos is a 32 y.o. female G2P0020, who presents for evaluation of yellow discharge after having a D&C 3 weeks ago.  Patient was seen 2 days ago and diagnosed with BV and started on metronidazole however she continues to have discharge.  Patient also reports pelvic and lower back pain.  Patient denies fevers and urinary symptoms.  Patient's greatest concern is to protect her fertility and is concerned about PID.     Physical Exam   Triage Vital Signs: ED Triage Vitals  Encounter Vitals Group     BP 01/18/23 1117 (!) 134/95     Systolic BP Percentile --      Diastolic BP Percentile --      Pulse Rate 01/18/23 1117 93     Resp 01/18/23 1117 16     Temp 01/18/23 1117 98.8 F (37.1 C)     Temp src --      SpO2 01/18/23 1117 99 %     Weight 01/18/23 1118 174 lb 2.6 oz (79 kg)     Height 01/18/23 1118 5\' 11"  (1.803 m)     Head Circumference --      Peak Flow --      Pain Score 01/18/23 1117 1     Pain Loc --      Pain Education --      Exclude from Growth Chart --     Most recent vital signs: Vitals:   01/18/23 1117  BP: (!) 134/95  Pulse: 93  Resp: 16  Temp: 98.8 F (37.1 C)  SpO2: 99%    General: Awake, no distress.  CV:  Good peripheral perfusion.  Resp:  Normal effort.  Abd:  No distention.  Pelvic:  No lesions on labia, vaginal mucosa moist, with yellow discharge noted in the vaginal vault.  No cervical motion tenderness.  No masses felt on bimanual exam.  No tenderness on bimanual exam.   ED Results / Procedures / Treatments   Labs (all labs ordered are listed, but only abnormal results are displayed) Labs Reviewed  COMPREHENSIVE METABOLIC PANEL - Abnormal; Notable for the following components:      Result Value   CO2 21 (*)    Calcium 8.8 (*)    AST 12 (*)     All other components within normal limits  URINALYSIS, ROUTINE W REFLEX MICROSCOPIC - Abnormal; Notable for the following components:   Color, Urine YELLOW (*)    APPearance CLEAR (*)    Ketones, ur 5 (*)    All other components within normal limits  HCG, QUANTITATIVE, PREGNANCY - Abnormal; Notable for the following components:   hCG, Beta Chain, Quant, S 617 (*)    All other components within normal limits  WET PREP, GENITAL  CHLAMYDIA/NGC RT PCR ([facility] ONLY)            CBC WITH DIFFERENTIAL/PLATELET     RADIOLOGY  Pelvic ultrasound obtained to evaluate for retained products of conception.  I interpreted the images as well as reviewed the radiologist report.   PROCEDURES:  Critical Care performed: No  Procedures   MEDICATIONS ORDERED IN ED: Medications - No data to display   IMPRESSION / MDM / ASSESSMENT AND PLAN / ED COURSE  I reviewed the triage vital signs and  the nursing notes.                             32 year old female presents for evaluation of ongoing vaginal discharge after a D&C 3 weeks ago.  VSS in triage aside from mildly elevated BP.  Patient is NAD on exam.  Differential diagnosis includes, but is not limited to, bacterial vaginosis, yeast infection, STI, retained products of conception, PID.  Patient's presentation is most consistent with acute complicated illness / injury requiring diagnostic workup.  CBC and CMP WNL.  Beta hCG remains elevated at 617.  With a notable for presence of ketones otherwise normal.  GC swab negative.  Wet prep was negative.  Pelvic ultrasound obtained to evaluate for retained products of conception, I interpreted the images as well as reviewed the radiologist report.  It showed thickening of the endometrium consistent with retained products of conception.  I consulted the midwife on-call given the ultrasound results.  The overseeing doctor reviewed the ultrasound and did not feel it was necessary to perform a procedure at  this point.  They will see her in the office in 2 weeks.  I believe patient's vaginal discharge is secondary to the retained products.  She is not presenting with any signs of a septic abortion given her normal vital signs and normal white count.  I do not believe patient has symptoms of PID as she did not have any cervical motion tenderness and was not positive for an STI.   Given her reassuring lab work and the okay from [facility], I feel patient is stable for outpatient management.  I explained all of these results with the patient, she voiced understanding, was agreeable to follow-up with the OB, and was stable at discharge.   FINAL CLINICAL IMPRESSION(S) / ED DIAGNOSES   Final diagnoses:  Vaginal discharge     Rx / DC Orders   ED Discharge Orders     None        Note:  This document was prepared using Dragon voice recognition software and may include unintentional dictation errors.   Cameron Ali, PA-C 01/18/23 1813    Minna Antis, MD 01/18/23 1924

## 2023-01-18 NOTE — Discharge Instructions (Signed)
Please follow-up with your OB in 2 weeks.  You can take Tylenol as needed for pain.  You can return to the ED with any new or worsening symptoms.

## 2023-01-18 NOTE — ED Notes (Signed)
Patient to US at this time.

## 2023-01-24 ENCOUNTER — Other Ambulatory Visit: Payer: Self-pay | Admitting: Unknown Physician Specialty

## 2023-01-24 DIAGNOSIS — R221 Localized swelling, mass and lump, neck: Secondary | ICD-10-CM

## 2023-02-02 ENCOUNTER — Ambulatory Visit (INDEPENDENT_AMBULATORY_CARE_PROVIDER_SITE_OTHER): Payer: Medicaid Other | Admitting: Obstetrics and Gynecology

## 2023-02-02 VITALS — BP 114/79 | HR 92 | Resp 16 | Ht 71.0 in | Wt 175.0 lb

## 2023-02-02 DIAGNOSIS — Z3A01 Less than 8 weeks gestation of pregnancy: Secondary | ICD-10-CM | POA: Diagnosis not present

## 2023-02-02 DIAGNOSIS — R102 Pelvic and perineal pain: Secondary | ICD-10-CM

## 2023-02-02 DIAGNOSIS — O021 Missed abortion: Secondary | ICD-10-CM | POA: Diagnosis not present

## 2023-02-02 DIAGNOSIS — O034 Incomplete spontaneous abortion without complication: Secondary | ICD-10-CM

## 2023-02-02 DIAGNOSIS — F4321 Adjustment disorder with depressed mood: Secondary | ICD-10-CM | POA: Diagnosis not present

## 2023-02-02 MED ORDER — MISOPROSTOL 200 MCG PO TABS: ORAL_TABLET | ORAL | 1 refills | Status: DC

## 2023-02-02 MED ORDER — IBUPROFEN 800 MG PO TABS: 800.0000 mg | ORAL_TABLET | Freq: Three times a day (TID) | ORAL | 0 refills | Status: AC | PRN

## 2023-02-02 NOTE — Progress Notes (Unsigned)
    GYNECOLOGY PROGRESS NOTE  Subjective:    Patient ID: Rose Campos, female    DOB: 04-16-1991, 32 y.o.   MRN: [id number]  HPI  Patient is a 33 y.o. G25P0020 female who presents for hospital follow up. She was evaluated at the ED on 01/18/2023 yellow discharge after having a D&C 3 weeks ago. She had been diagnosed with BV and continued to have discharge after treatment. She had ultrasound done and it showed thickened endometrium consistent with retained products of conception. She reports that she had a miscarriage mid June at a clinic in [place] around the end of June.  The following portions of the patient's history were reviewed and updated as appropriate: allergies, current medications, past family history, past medical history, past social history, past surgical history, and problem list.  Review of Systems Pertinent items are noted in HPI.   Objective:   Blood pressure 114/79, pulse 92, resp. rate 16, height 5\' 11"  (1.803 m), weight 175 lb (79.4 kg), SpO2 98%, unknown if currently breastfeeding. Body mass index is 24.41 kg/m. General appearance: alert, cooperative, and no distress Abdomen: {abdominal exam:16834} Pelvic: {pelvic exam:16852::"cervix normal in appearance","external genitalia normal","no adnexal masses or tenderness","no cervical motion tenderness","rectovaginal septum normal","uterus normal size, shape, and consistency","vagina normal without discharge"} Extremities: {extremity exam:5109} Neurologic: {neuro exam:17854}   Assessment:   No diagnosis found.   Plan:   There are no diagnoses linked to this encounter.     Hildred Laser, MD [facility]

## 2023-02-03 ENCOUNTER — Encounter: Payer: Self-pay | Admitting: Obstetrics and Gynecology

## 2023-02-05 ENCOUNTER — Telehealth: Payer: Self-pay

## 2023-02-05 NOTE — Telephone Encounter (Signed)
She should have an appointment this Thursday with me to follow up (and ultrasound).

## 2023-02-05 NOTE — Telephone Encounter (Signed)
Can you call and schedule this with Dr Valentino Saxon?

## 2023-02-05 NOTE — Telephone Encounter (Signed)
I have scheduled the pt for 02/08/23 for an Korea at 8:15.  Pt will f/u with Dr. Valentino Saxon via MyChart video visit at 4:15.  Called pt and left a message.  Also send a MyChart message to pt.

## 2023-02-05 NOTE — Telephone Encounter (Signed)
Pt called to report she started bleeding Saturday, started really dark and turned to really bright red, pt wanted to let Dr Valentino Saxon know she would start the Cytotec today, I advise pt this is fine, to make sure everything has passed.

## 2023-02-06 NOTE — Telephone Encounter (Signed)
I called this pt to confirm the appts for 02/08/23, but she has to work.  She is not going to be able to do the appts.  The next time that the pt was available (bc of her work schedule) and we had availability also was on 02/21/2023.  I have scheduled her for ultrasound at 2:00 with the MyChart video visit at 4:15.

## 2023-02-07 ENCOUNTER — Ambulatory Visit (INDEPENDENT_AMBULATORY_CARE_PROVIDER_SITE_OTHER): Payer: Medicaid Other | Admitting: Family

## 2023-02-07 ENCOUNTER — Ambulatory Visit
Admission: RE | Admit: 2023-02-07 | Discharge: 2023-02-07 | Disposition: A | Discharge status: Home / Self Care | Payer: Medicaid Other | Source: Ambulatory Visit | Attending: Unknown Physician Specialty | Admitting: Unknown Physician Specialty

## 2023-02-07 DIAGNOSIS — R221 Localized swelling, mass and lump, neck: Secondary | ICD-10-CM

## 2023-02-07 DIAGNOSIS — R350 Frequency of micturition: Secondary | ICD-10-CM | POA: Diagnosis not present

## 2023-02-07 LAB — POCT URINALYSIS DIPSTICK
Bilirubin, UA: NEGATIVE
Blood, UA: NEGATIVE
Glucose, UA: NEGATIVE
Nitrite, UA: NEGATIVE
Protein, UA: NEGATIVE
Spec Grav, UA: 1.015 (ref 1.010–1.025)
Urobilinogen, UA: 0.2 E.U./dL
pH, UA: 8 (ref 5.0–8.0)

## 2023-02-08 ENCOUNTER — Telehealth: Payer: PRIVATE HEALTH INSURANCE | Admitting: Obstetrics and Gynecology

## 2023-02-08 ENCOUNTER — Other Ambulatory Visit: Payer: PRIVATE HEALTH INSURANCE

## 2023-02-09 ENCOUNTER — Telehealth: Payer: Self-pay

## 2023-02-09 MED ORDER — NITROFURANTOIN MONOHYD MACRO 100 MG PO CAPS: 100.0000 mg | ORAL_CAPSULE | Freq: Two times a day (BID) | ORAL | 0 refills | Status: DC

## 2023-02-09 NOTE — Telephone Encounter (Signed)
Spoke with pt and informed where ABT was sent.

## 2023-02-11 NOTE — Progress Notes (Signed)
   Subjective   CHIEF COMPLAINT  Possible UTI?     REASON FOR VISIT  UA only        Objective   Results for orders placed or performed in visit on 02/07/23  POCT urinalysis dipstick  Result Value Ref Range   Color, UA     Clarity, UA     Glucose, UA Negative Negative   Bilirubin, UA negative    Ketones, UA 1+    Spec Grav, UA 1.015 1.010 - 1.025   Blood, UA neg    pH, UA 8.0 5.0 - 8.0   Protein, UA Negative Negative   Urobilinogen, UA 0.2 0.2 or 1.0 E.U./dL   Nitrite, UA negative    Leukocytes, UA Trace (A) Negative   Appearance     Odor      Assessment & Plan  1. Frequent urination Dipstick in office today abnormal.  Will send antibiotics - POCT urinalysis dipstick   Total time spent: 5 minutes  Miki Kins, FNP 02/07/2023

## 2023-02-20 NOTE — Progress Notes (Unsigned)
GYNECOLOGY PROGRESS NOTE  Subjective:    Patient ID: Rose Campos, female    DOB: 1990-12-12, 32 y.o.   MRN: [id number]  HPI  Patient is a 32 y.o. [id number] female who presents for ultrasound follow up for suspected retained products of conception following a surgical abortion in 12/29/2022. She had an ultrasound done today. She is having some ongoing pelvic pain/cramping.  Does report that after her last visit she had a small amount of dark red spotting and then an episode of brighter red bleeding then stopped after 2 days.  She decided to go ahead and take the prescribed Cytotec which stopped her bleeding.  A week or so later, she had another episode of spotting followed by brighter red bleeding x 2 hours then stopped again.   The following portions of the patient's history were reviewed and updated as appropriate: allergies, current medications, past family history, past medical history, past social history, past surgical history, and problem list.  Review of Systems Pertinent items noted in HPI and remainder of comprehensive ROS otherwise negative.   Objective:   Blood pressure 123/87, pulse 98, resp. rate 16, height 5\' 11"  (1.803 m), weight 171 lb 9.6 oz (77.8 kg), unknown if currently breastfeeding. Body mass index is 23.93 kg/m. General appearance: alert and no distress Remainder of exam deferred.    Labs:   Latest Reference Range & Units 12/15/22 10:59 01/18/23 12:29 02/02/23 15:17  hCG Quant mIU/mL 75,679  40  HCG, Beta Chain, Quant, S <5 mIU/mL  617 (H)   (H): Data is abnormally high    Imaging:  CLINICAL DATA:  Pelvic pain after dilatation and curettage 3 weeks ago.   EXAM: TRANSABDOMINAL AND TRANSVAGINAL ULTRASOUND OF PELVIS   DOPPLER ULTRASOUND OF OVARIES   TECHNIQUE: Both transabdominal and transvaginal ultrasound examinations of the pelvis were performed. Transabdominal technique was performed for global imaging of the pelvis including uterus, ovaries,  adnexal regions, and pelvic cul-de-sac.   It was necessary to proceed with endovaginal exam following the transabdominal exam to visualize the endometrium and ovaries. Color and duplex Doppler ultrasound was utilized to evaluate blood flow to the ovaries.   COMPARISON:  December 18, 2022.   FINDINGS: Uterus   Measurements: 6.0 x 5.4 x 4.5 cm = volume: 78 mL. No fibroids or other mass visualized.   Endometrium   Thickness: 16 mm. Increased flow is noted on color Doppler suggesting retained products of conception.   Right ovary   Measurements: 3.0 x 2.3 x 2.3 cm = volume: 9 mL. Normal appearance/no adnexal mass.   Left ovary   Measurements: 4.4 x 2.6 x 2.5 cm = volume: 15 mL. Normal appearance/no adnexal mass.   Pulsed Doppler evaluation of both ovaries demonstrates normal low-resistance arterial and venous waveforms.   Other findings   Trace free fluid is noted which most likely is physiologic.   IMPRESSION: Thickened endometrium is noted with increased flow on color Doppler suggesting retained products of conception.   No evidence of ovarian mass or torsion.     Electronically Signed   By: Lupita Raider M.D.   On: 01/18/2023 15:57    Waldo Laine, RT on 02/21/2023  2:39 PM  ULTRASOUND REPORT   Location: [facility] Date of Service: 02/21/2023        Indications:Pelvic Pain Findings:  The uterus is retroverted and measures 10.64 x 6.10 x 4.63 cm. Echo texture is homogenous without evidence of focal masses.  The Endometrium measures 8.9 mm. Thickened - ?retained products still seen in fundal aspect    Right Ovary measures 3.70 x 2.32 x 2.94 cm. It is not normal in appearance. - ring of fire sign seen ? Corpus luteal still seen Left Ovary measures 3.14 x 2.38 x 1.67  cm. It is normal in appearance. Survey of the adnexa demonstrates no adnexal masses. There is no free fluid in the cul de sac.   Impression: 1. Thickened endo -  probable retained products   Recommendations: 1.Clinical correlation with the patient's History and Physical Exam. 2. Follow up with provider    Waldo Laine, RT   Assessment:   1. Retained products of conception following abortion   2. PCOS (polycystic ovarian syndrome)      Plan:   - Preliminary report still noting an area in the fundus that may resemble POCs. Patient is s/p attempted medical management with Cytotec.  WIll repeat BHCG levels as last level had declined down to 40. If still continuing to decline, will attempt to manage expectantly.  - H/o PCOS, notes cycles were slightly irregular, coming every 35-48 days. Dx in 2022.  Notes desires for fertility, has been having unprotected intercourse for the past 6-7 years with no pregnancy until recently.  Discussed regulation of cycles with OCPs for 1-2 months, may also help to have a regular cycle to remove final remains. Patient ok to try. Given sample of Nextellis.    A total of 15 minutes were spent face-to-face with the patient during this encounter and over half of that time dealt with counseling and coordination of care.    Hildred Laser, MD [facility]

## 2023-02-21 ENCOUNTER — Ambulatory Visit: Payer: Medicaid Other

## 2023-02-21 ENCOUNTER — Other Ambulatory Visit: Payer: Self-pay | Admitting: Obstetrics and Gynecology

## 2023-02-21 ENCOUNTER — Encounter: Payer: Self-pay | Admitting: Obstetrics and Gynecology

## 2023-02-21 ENCOUNTER — Ambulatory Visit (INDEPENDENT_AMBULATORY_CARE_PROVIDER_SITE_OTHER): Payer: Medicaid Other | Admitting: Obstetrics and Gynecology

## 2023-02-21 VITALS — BP 123/87 | HR 98 | Resp 16 | Ht 71.0 in | Wt 171.6 lb

## 2023-02-21 DIAGNOSIS — R102 Pelvic and perineal pain: Secondary | ICD-10-CM

## 2023-02-21 DIAGNOSIS — O034 Incomplete spontaneous abortion without complication: Secondary | ICD-10-CM

## 2023-02-21 DIAGNOSIS — E282 Polycystic ovarian syndrome: Secondary | ICD-10-CM

## 2023-02-21 DIAGNOSIS — F4321 Adjustment disorder with depressed mood: Secondary | ICD-10-CM

## 2023-02-22 LAB — HUMAN CHORIONIC GONADOTROPIN(HCG),B-SUBUNIT,QUANTITATIVE): HCG, Beta Chain, Quant, S: 14 m[IU]/mL

## 2023-02-23 ENCOUNTER — Other Ambulatory Visit: Payer: PRIVATE HEALTH INSURANCE

## 2023-02-23 ENCOUNTER — Ambulatory Visit: Payer: PRIVATE HEALTH INSURANCE | Admitting: Obstetrics and Gynecology

## 2023-03-06 ENCOUNTER — Encounter: Payer: Self-pay | Admitting: Obstetrics and Gynecology

## 2023-03-07 MED ORDER — NORETHINDRONE ACET-ETHINYL EST 1.5-30 MG-MCG PO TABS: 1.0000 | ORAL_TABLET | Freq: Every day | ORAL | 3 refills | Status: DC

## 2023-03-13 MED ORDER — NEXTSTELLIS 3-14.2 MG PO TABS: 1.0000 | ORAL_TABLET | Freq: Every day | ORAL | 3 refills | Status: DC

## 2023-03-13 NOTE — Addendum Note (Signed)
Addended by: Fabian November on: 03/13/2023 10:15 PM   Modules accepted: Orders

## 2023-06-11 ENCOUNTER — Ambulatory Visit: Payer: PRIVATE HEALTH INSURANCE | Admitting: Dermatology

## 2023-07-30 ENCOUNTER — Encounter: Payer: Self-pay | Admitting: Family Medicine

## 2023-07-30 ENCOUNTER — Ambulatory Visit: Payer: Medicaid Other | Admitting: Family Medicine

## 2023-07-30 DIAGNOSIS — R590 Localized enlarged lymph nodes: Secondary | ICD-10-CM | POA: Insufficient documentation

## 2023-07-30 DIAGNOSIS — Z113 Encounter for screening for infections with a predominantly sexual mode of transmission: Secondary | ICD-10-CM

## 2023-07-30 LAB — HM HIV SCREENING LAB: HM HIV Screening: NEGATIVE

## 2023-07-30 LAB — WET PREP FOR TRICH, YEAST, CLUE
Trichomonas Exam: NEGATIVE
Yeast Exam: NEGATIVE

## 2023-07-30 LAB — HEPATITIS B SURFACE ANTIGEN

## 2023-07-30 LAB — HM HEPATITIS C SCREENING LAB: HM Hepatitis Screen: NEGATIVE

## 2023-07-30 NOTE — Progress Notes (Signed)
Pt is here for STD visit.  Wet prep results reviewed with pt, no treatment required per standing order.  Condoms declined.  Gaspar Garbe, RN

## 2023-07-30 NOTE — Progress Notes (Signed)
[facility] Department STI clinic [address], [address] [place] [address] Main phone: [phone]  STI screening visit  Subjective:  Rose Campos is a 33 y.o. female being seen today for an STI screening visit. The patient reports they do not have symptoms.  Patient reports that they do not desire a pregnancy in the next year.   They reported they are not interested in discussing contraception today.    Patient's last menstrual period was 07/05/2023.  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 No  Last HIV test per patient/review of record was  Lab Results  Component Value Date   HMHIVSCREEN Negative - Validated 02/02/2021    Lab Results  Component Value Date   HIV Non Reactive 02/06/2022     Last HEPC test per patient/review of record was No results found for: "HMHEPCSCREEN" No components found for: "HEPC"   Last HEPB test per patient/review of record was No components found for: "HMHEPBSCREEN" No components found for: "HEPC"   Patient reports last pap was: 2020     Component Value Date/Time   DIAGPAP  09/09/2018 0000    NEGATIVE FOR INTRAEPITHELIAL LESIONS OR MALIGNANCY.   DIAGPAP  09/09/2018 0000    FUNGAL ORGANISMS PRESENT CONSISTENT WITH CANDIDA SPP.   ADEQPAP  09/09/2018 0000    Satisfactory for evaluation  endocervical/transformation zone component PRESENT.   No results found for: "SPECADGYN" Result Date Procedure Results  Follow-ups  09/09/2018 Cytology - PAP Adequacy: Satisfactory for evaluation  endocervical/transformation zone component PRESENT. Diagnosis: NEGATIVE FOR INTRAEPITHELIAL LESIONS OR MALIGNANCY. Diagnosis: FUNGAL ORGANISMS PRESENT CONSISTENT WITH CANDIDA SPP. Material Submitted: CervicoVaginal Pap [ThinPrep Imaged] CYTOLOGY - PAP: PAP RESULT     Screening for MPX risk: Does the patient have an unexplained rash? No Is the patient MSM? No Does the patient endorse multiple sex partners or anonymous sex partners? No Did the patient have close or sexual contact with a person diagnosed with MPX? No Has the patient traveled outside the [place] where MPX is endemic? No Is there a high clinical suspicion for MPX-- evidenced by one of the following No  -Unlikely to be chickenpox  -Lymphadenopathy  -Rash that present in same phase of evolution on any given body part See flowsheet for further details and programmatic requirements.   Immunization history:  Immunization History  Administered Date(s) Administered   Influenza,inj,Quad PF,6+ Mos 07/03/2019     The following portions of the patient's history were reviewed and updated as appropriate: allergies, current medications, past medical history, past social history, past surgical history and problem list.  Objective:  There were no vitals filed for this visit.  Physical Exam Vitals and nursing note reviewed.  Constitutional:      Appearance: Normal appearance.  HENT:     Head: Normocephalic.     Mouth/Throat:     Mouth: Mucous membranes are moist.  Cardiovascular:     Rate and Rhythm: Normal rate.  Pulmonary:  Effort: Pulmonary effort is normal.  Abdominal:     Palpations: Abdomen is soft.  Genitourinary:    Comments: Declined genital exam- no symptoms, self swabbed Musculoskeletal:        General: Normal range of motion.  Lymphadenopathy:     Head:     Right side of head: No submandibular, preauricular or posterior auricular  adenopathy.     Left side of head: No submandibular, preauricular or posterior auricular adenopathy.     Cervical: Cervical adenopathy present.     Upper Body:     Right upper body: No supraclavicular or axillary adenopathy.     Left upper body: No supraclavicular or axillary adenopathy.     Comments: 1 small swollen lymph node palpable on left posterior cervical  Skin:    General: Skin is warm and dry.  Neurological:     Mental Status: She is alert and oriented to person, place, and time.  Psychiatric:        Mood and Affect: Mood normal.     Assessment and Plan:  Rose Campos is a 33 y.o. female presenting to the [facility] Department for STI screening  1. Screening for venereal disease (Primary)  - Chlamydia/Gonorrhea [facility] - Syphilis Serology, [facility] - WET PREP FOR TRICH, YEAST, CLUE - HBV Antigen/Antibody [facility] - HIV/HCV  Lab  2. Lymphadenopathy, cervical -1 small moveable lymph node on left posterior mid neck near base of neck -reports this is chronic and has been imaged by ENT -was told this was due to respiratory illness she had last year- is worried that it hasn't gone away- encouraged to speak with PCP/ can go back to ENT    Patient accepted all screenings including vaginal CT/GC and bloodwork for HIV/RPR, and wet prep. Patient meets criteria for HepB screening? No. 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 No. Ordered? 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  Counseled to return or seek care for continued or worsening symptoms Recommended repeat testing in 3 months with positive results. Recommended condom use with all sex for STI prevention.   Patient is currently  using  nothing  to prevent pregnancy.    Return if symptoms worsen or fail to improve, for STI screening.  No future appointments.  Lenice Llamas, [place]

## 2024-01-16 ENCOUNTER — Encounter: Payer: Self-pay | Admitting: Family

## 2024-01-16 ENCOUNTER — Ambulatory Visit: Admitting: Family

## 2024-01-16 VITALS — BP 138/82 | HR 87 | Ht 71.0 in | Wt 194.6 lb

## 2024-01-16 DIAGNOSIS — N760 Acute vaginitis: Secondary | ICD-10-CM | POA: Diagnosis not present

## 2024-01-16 DIAGNOSIS — Z202 Contact with and (suspected) exposure to infections with a predominantly sexual mode of transmission: Secondary | ICD-10-CM

## 2024-01-16 DIAGNOSIS — E782 Mixed hyperlipidemia: Secondary | ICD-10-CM

## 2024-01-16 DIAGNOSIS — I10 Essential (primary) hypertension: Secondary | ICD-10-CM

## 2024-01-16 DIAGNOSIS — R7303 Prediabetes: Secondary | ICD-10-CM

## 2024-01-16 DIAGNOSIS — L0591 Pilonidal cyst without abscess: Secondary | ICD-10-CM | POA: Diagnosis not present

## 2024-01-16 DIAGNOSIS — R5383 Other fatigue: Secondary | ICD-10-CM

## 2024-01-16 DIAGNOSIS — R300 Dysuria: Secondary | ICD-10-CM

## 2024-01-16 DIAGNOSIS — E538 Deficiency of other specified B group vitamins: Secondary | ICD-10-CM | POA: Diagnosis not present

## 2024-01-16 DIAGNOSIS — E559 Vitamin D deficiency, unspecified: Secondary | ICD-10-CM | POA: Diagnosis not present

## 2024-01-16 LAB — POCT URINALYSIS DIPSTICK
Bilirubin, UA: NEGATIVE
Glucose, UA: NEGATIVE
Ketones, UA: NEGATIVE
Nitrite, UA: NEGATIVE
Protein, UA: NEGATIVE
Spec Grav, UA: 1.015 (ref 1.010–1.025)
Urobilinogen, UA: 0.2 U/dL
pH, UA: 6 (ref 5.0–8.0)

## 2024-01-16 MED ORDER — SULFAMETHOXAZOLE-TRIMETHOPRIM 800-160 MG PO TABS: 1.0000 | ORAL_TABLET | Freq: Two times a day (BID) | ORAL | 0 refills | Status: AC

## 2024-01-16 NOTE — Progress Notes (Signed)
 Established Patient Office Visit  Subjective:  Patient ID: Rose Campos, female    DOB: 11/11/1990  Age: 33 y.o. MRN: [id number]  Chief Complaint  Patient presents with   Follow-up    Patient is here today with a few concerns: 1) She has been avoiding coming in to the doctor, despite a possible UTI.  She thinks it started off as BV and that caused her a UTI. She has been having burning, itching, and dysuria.  2) Asks if we can check her labs today.  3) She has a lump on her back that she is concerned about.  She has pain in the area, and says that she first noticed It a bit ago, but it has gotten larger.  The lump is right above her gluteal cleft.     No other concerns at this time.   Past Medical History:  Diagnosis Date   Gastritis    Ovarian cyst    Polycystic ovary syndrome    Toe fracture, right    big toe   UTI (urinary tract infection)     Past Surgical History:  Procedure Laterality Date   COSMETIC SURGERY     lipo   DILATION AND CURETTAGE OF UTERUS  12/29/2022   pregnancy termination   WISDOM TOOTH EXTRACTION     four;  done at diff times throughout her 69s    Social History   Socioeconomic History   Marital status: Single    Spouse name: Not on file   Number of children: 0   Years of education: 16   Highest education level: Bachelor's degree (e.g., BA, AB, BS)  Occupational History   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  Tobacco Use   Smoking status: Every Day    Current packs/day: 0.50    Average packs/day: 0.5 packs/day for 5.0 years (2.5 ttl pk-yrs)    Types: Cigarettes   Smokeless tobacco: Never   Tobacco comments:    Quit with preg  Vaping Use   Vaping status: Never Used  Substance and Sexual Activity   Alcohol use: Not Currently    Alcohol/week: 1.0 standard drink of alcohol    Types: 1 Glasses of wine per week    Comment: ocassionally; none since pregnant   Drug use: Not Currently    Comment: MJ use before pregnant per  pt   Sexual activity: Yes    Partners: Male    Birth control/protection: None    Comment: hx of patch, pill, and depo  Other Topics Concern   Not on file  Social History Narrative   Not on file   Social Drivers of Health   Financial Resource Strain: Medium Risk (12/27/2022)   Overall Financial Resource Strain (CARDIA)    Difficulty of Paying Living Expenses: Somewhat hard  Food Insecurity: Food Insecurity Present (12/27/2022)   Hunger Vital Sign    Worried About Running Out of Food in the Last Year: Never true    Ran Out of Food in the Last Year: Sometimes true  Transportation Needs: No Transportation Needs (12/27/2022)   PRAPARE - Administrator, Civil Service (Medical): No    Lack of Transportation (Non-Medical): No  Physical Activity: Sufficiently Active (12/27/2022)   Exercise Vital Sign    Days of Exercise per Week: 7 days    Minutes of Exercise per Session: 30 min  Stress: Stress Concern Present (12/27/2022)   [employer] of Occupational Health - Occupational Stress Questionnaire    Feeling  of Stress : To some extent  Social Connections: Unknown (12/27/2022)   Social Connection and Isolation Panel    Frequency of Communication with Friends and Family: Three times a week    Frequency of Social Gatherings with Friends and Family: Once a week    Attends Religious Services: Never    Database administrator or Organizations: No    Attends Banker Meetings: Never    Marital Status: Not on file  Intimate Partner Violence: Not At Risk (12/27/2022)   Humiliation, Afraid, Rape, and Kick questionnaire    Fear of Current or Ex-Partner: No    Emotionally Abused: No    Physically Abused: No    Sexually Abused: No    Family History  Problem Relation Age of Onset   Lupus Mother    Atrial fibrillation Father    Healthy Brother    Hyperlipidemia Maternal Grandmother    Hypertension Maternal Grandmother    Cancer Maternal Grandmother 43       uterine    Kidney disease Maternal Grandfather    Breast cancer Maternal Great-grandmother     Allergies  Allergen Reactions   Copper-Containing Compounds Rash   Nickel Other (See Comments) and Rash    Review of Systems  Genitourinary:  Positive for dysuria and urgency.  Skin:        Lump above buttocks  All other systems reviewed and are negative.      Objective:   BP 138/82   Pulse 87   Ht 5' 11 (1.803 m)   Wt 194 lb 9.6 oz (88.3 kg)   LMP 12/24/2023 (Exact Date)   SpO2 98%   Breastfeeding No   BMI 27.14 kg/m   Vitals:   01/16/24 1400  BP: 138/82  Pulse: 87  Height: 5' 11 (1.803 m)  Weight: 194 lb 9.6 oz (88.3 kg)  SpO2: 98%  BMI (Calculated): 27.15    Physical Exam Vitals and nursing note reviewed.  Constitutional:      Appearance: Normal appearance. She is normal weight.  HENT:     Head: Normocephalic.  Eyes:     Extraocular Movements: Extraocular movements intact.     Conjunctiva/sclera: Conjunctivae normal.     Pupils: Pupils are equal, round, and reactive to light.  Cardiovascular:     Rate and Rhythm: Normal rate.  Pulmonary:     Effort: Pulmonary effort is normal.  Skin:     Neurological:     General: No focal deficit present.     Mental Status: She is alert and oriented to person, place, and time. Mental status is at baseline.  Psychiatric:        Mood and Affect: Mood normal.        Behavior: Behavior normal.        Thought Content: Thought content normal.        Judgment: Judgment normal.      No results found for any visits on 01/16/24.  No results found for this or any previous visit (from the past 2160 hours).     Assessment & Plan Essential hypertension, benign Blood pressure well controlled with current medications.  Continue current therapy.  Will reassess at follow up.   - CBC w/Diff - CMP w/eGFR  Vitamin D  deficiency, unspecified B12 deficiency due to diet Other fatigue Checking labs today.  Will continue supplements as  needed.   - Vitamin D  - Vitamin B12 - TSH  Prediabetes A1C Continues to be in prediabetic ranges.  Will reassess at follow up after next lab check.  Patient counseled on dietary choices and verbalized understanding.   -CBC w/Diff -CMP w/eGFR -Hemoglobin A1C  Mixed hyperlipidemia Checking labs today.  Continue current therapy for lipid control. Will modify as needed based on labwork results.   -CMP w/eGFR -Lipid Panel  Dysuria UA in office abnormal.  Sending for UA/UC.   Also sending RX for antibiotics for pt.  Will call with results when available.   Acute vaginitis Contact with and (suspected) exposure to infections with a predominantly sexual mode of transmission NuSwab sent today.  Will call with results when they are available.   Pilonidal cyst Sending referral to General Surgery.  Will defer to them for removal decisions.      Return to be determined based on results.   Total time spent: 30 minutes  ALAN CHRISTELLA ARRANT, FNP  01/16/2024   This document may have been prepared by [facility] Voice Recognition software and as such may include unintentional dictation errors.

## 2024-01-17 ENCOUNTER — Telehealth: Payer: Self-pay | Admitting: Family

## 2024-01-17 LAB — URINALYSIS, ROUTINE W REFLEX MICROSCOPIC
Bilirubin, UA: NEGATIVE
Glucose, UA: NEGATIVE
Ketones, UA: NEGATIVE
Nitrite, UA: NEGATIVE
Specific Gravity, UA: 1.014 (ref 1.005–1.030)
Urobilinogen, Ur: 0.2 mg/dL (ref 0.2–1.0)
pH, UA: 6.5 (ref 5.0–7.5)

## 2024-01-17 LAB — MICROSCOPIC EXAMINATION
Bacteria, UA: NONE SEEN
Casts: NONE SEEN /LPF
Epithelial Cells (non renal): NONE SEEN /HPF (ref 0–10)
RBC, Urine: >30 /HPF — AB (ref 0–2)
WBC, UA: >30 /HPF — AB (ref 0–5)

## 2024-01-17 LAB — CMP14+EGFR
ALT: 7 IU/L (ref 0–32)
AST: 15 IU/L (ref 0–40)
Albumin: 4.8 g/dL (ref 4.0–5.0)
Alkaline Phosphatase: 61 IU/L (ref 44–121)
BUN/Creatinine Ratio: 9 (ref 9–23)
BUN: 7 mg/dL (ref 6–20)
Bilirubin Total: <0.2 mg/dL (ref 0.0–1.2)
CO2: 20 mmol/L (ref 20–29)
Calcium: 9.9 mg/dL (ref 8.7–10.2)
Chloride: 102 mmol/L (ref 96–106)
Creatinine, Ser: 0.76 mg/dL (ref 0.57–1.00)
Globulin, Total: 2.6 g/dL (ref 1.5–4.5)
Glucose: 79 mg/dL (ref 70–99)
Potassium: 4.1 mmol/L (ref 3.5–5.2)
Sodium: 139 mmol/L (ref 134–144)
Total Protein: 7.4 g/dL (ref 6.0–8.5)
eGFR: 108 mL/min/1.73 (ref >59)

## 2024-01-17 LAB — CBC WITH DIFFERENTIAL/PLATELET
Basophils Absolute: 0.1 x10E3/uL (ref 0.0–0.2)
Basos: 1 %
EOS (ABSOLUTE): 0.2 x10E3/uL (ref 0.0–0.4)
Eos: 2 %
Hematocrit: 43.9 % (ref 34.0–46.6)
Hemoglobin: 14.6 g/dL (ref 11.1–15.9)
Immature Grans (Abs): 0 x10E3/uL (ref 0.0–0.1)
Immature Granulocytes: 0 %
Lymphocytes Absolute: 2.4 x10E3/uL (ref 0.7–3.1)
Lymphs: 26 %
MCH: 30.3 pg (ref 26.6–33.0)
MCHC: 33.3 g/dL (ref 31.5–35.7)
MCV: 91 fL (ref 79–97)
Monocytes Absolute: 0.7 x10E3/uL (ref 0.1–0.9)
Monocytes: 8 %
Neutrophils Absolute: 5.8 x10E3/uL (ref 1.4–7.0)
Neutrophils: 63 %
Platelets: 303 x10E3/uL (ref 150–450)
RBC: 4.82 x10E6/uL (ref 3.77–5.28)
RDW: 13.7 % (ref 11.7–15.4)
WBC: 9.2 x10E3/uL (ref 3.4–10.8)

## 2024-01-17 LAB — HEMOGLOBIN A1C
Est. average glucose Bld gHb Est-mCnc: 103 mg/dL
Hgb A1c MFr Bld: 5.2 % (ref 4.8–5.6)

## 2024-01-17 LAB — LIPID PANEL
Chol/HDL Ratio: 4.3 ratio (ref 0.0–4.4)
Cholesterol, Total: 154 mg/dL (ref 100–199)
HDL: 36 mg/dL — ABNORMAL LOW (ref >39)
LDL Chol Calc (NIH): 94 mg/dL (ref 0–99)
Triglycerides: 132 mg/dL (ref 0–149)
VLDL Cholesterol Cal: 24 mg/dL (ref 5–40)

## 2024-01-17 LAB — VITAMIN B12: Vitamin B-12: 524 pg/mL (ref 232–1245)

## 2024-01-17 LAB — IRON,TIBC AND FERRITIN PANEL
Ferritin: 45 ng/mL (ref 15–150)
Iron Saturation: 18 % (ref 15–55)
Iron: 61 ug/dL (ref 27–159)
Total Iron Binding Capacity: 346 ug/dL (ref 250–450)
UIBC: 285 ug/dL (ref 131–425)

## 2024-01-17 LAB — TSH: TSH: 0.616 u[IU]/mL (ref 0.450–4.500)

## 2024-01-17 LAB — VITAMIN D 25 HYDROXY (VIT D DEFICIENCY, FRACTURES): Vit D, 25-Hydroxy: 30.9 ng/mL (ref 30.0–100.0)

## 2024-01-17 NOTE — Telephone Encounter (Signed)
 Patient left VM stating that she seen her microscopic exam has came back on MyChart and she thinks she has BV. She wants to know if were going to send her in something.  It looks like she is referring to her urinalysis that came back, not the NuSwab that checks for BV.

## 2024-01-17 NOTE — Telephone Encounter (Signed)
 Patient called again and left another VM stating this same message.   I called her back to tell her that the BV swab results are NOT back yet but she did not answer and has no VM set up.

## 2024-01-18 LAB — URINE CULTURE: Organism ID, Bacteria: NO GROWTH

## 2024-01-19 LAB — NUSWAB VG+, HSV
Atopobium vaginae: HIGH {score} — AB
BVAB 2: HIGH {score} — AB
Candida albicans, NAA: NEGATIVE
Candida glabrata, NAA: NEGATIVE
Chlamydia trachomatis, NAA: NEGATIVE
HSV 1 NAA: NEGATIVE
HSV 2 NAA: NEGATIVE
Megasphaera 1: HIGH {score} — AB
Neisseria gonorrhoeae, NAA: POSITIVE — AB
Trich vag by NAA: NEGATIVE

## 2024-01-20 ENCOUNTER — Ambulatory Visit: Payer: Self-pay | Admitting: Family

## 2024-01-20 DIAGNOSIS — R350 Frequency of micturition: Secondary | ICD-10-CM | POA: Diagnosis not present

## 2024-01-20 DIAGNOSIS — A549 Gonococcal infection, unspecified: Secondary | ICD-10-CM | POA: Diagnosis not present

## 2024-01-20 MED ORDER — FLUCONAZOLE 150 MG PO TABS: 150.0000 mg | ORAL_TABLET | Freq: Every day | ORAL | 0 refills | Status: DC

## 2024-01-20 MED ORDER — CEFIXIME 400 MG PO CAPS: 800.0000 mg | ORAL_CAPSULE | Freq: Once | ORAL | 0 refills | Status: AC

## 2024-01-20 MED ORDER — METRONIDAZOLE 500 MG PO TABS: 500.0000 mg | ORAL_TABLET | Freq: Two times a day (BID) | ORAL | 0 refills | Status: DC

## 2024-01-23 ENCOUNTER — Ambulatory Visit: Payer: Self-pay | Admitting: Surgery

## 2024-01-23 ENCOUNTER — Encounter: Payer: Self-pay | Admitting: Surgery

## 2024-01-23 VITALS — BP 106/68 | HR 93 | Temp 98.8°F | Ht 71.0 in | Wt 188.0 lb

## 2024-01-23 DIAGNOSIS — L089 Local infection of the skin and subcutaneous tissue, unspecified: Secondary | ICD-10-CM

## 2024-01-23 DIAGNOSIS — L723 Sebaceous cyst: Secondary | ICD-10-CM

## 2024-01-23 NOTE — Patient Instructions (Addendum)
 Do finish all of your antibiotics. We did not see evidence of a Pilonidal Cyst today.  Please let us  know if this area becomes sore and swollen so you can come in to be seen.  Follow-up with our office as needed.  Please call and ask to speak with a nurse if you develop questions or concerns.   Pocket of Fluid in the Skin (Epidermoid Cyst): What to Know  An epidermoid cyst is a small lump under your skin. The cyst contains a substance that is thick and oily. What are the causes? A blocked hair follicle. A hair curls and re-enters the skin instead of growing straight out of the skin. A blocked pore. Irritated skin. An injury to the skin. Certain conditions that are passed from parent to child. Human papillomavirus (HPV). This happens rarely when cysts occur on the bottom of the feet. Long-term sun damage to the skin. What increases the risk? Having acne. Being female. Having an injury to the skin. Being past puberty. Certain conditions that are passed down through family (genetic disorder). What are the signs or symptoms? The only sign of this type of cyst may be a small, painless lump under the skin. These cysts are usually painless, but they can get infected. Symptoms of infection may include: Redness. Inflammation. Tenderness. Warmth. Fever. A bad-smelling substance that drains from the cyst. Pus that drains from the cyst. How is this treated? If a cyst becomes inflamed, treatment may include: Opening and draining the cyst. Antibiotics. Shots of medicines called steroids that help lessen inflammation. Surgery to remove the cyst if it's large, painful, or could turn into cancer. Do not try to open or squeeze a cyst yourself. Follow these instructions at home: Medicines Take your medicines only as told. If you were given antibiotics, take them as told. Do not stop taking them even if you start to feel better. General instructions Keep the area around your cyst clean and  dry. Wear loose, dry clothing. Avoid touching your cyst. Check your cyst every day for signs of infection. Check for: Redness, swelling, or pain. Fluid or blood. Warmth. Pus or a bad smell. Keep all follow-up visits to make sure there's no discomfort or infection. Contact a doctor if: You have any signs of infection. Your cyst doesn't get better or gets worse. You get a cyst that looks different from other cysts you've had. You have a fever. You have redness that spreads from the cyst. This information is not intended to replace advice given to you by your health care provider. Make sure you discuss any questions you have with your health care provider. Document Revised: 02/02/2023 Document Reviewed: 02/02/2023 Elsevier Patient Education  2024 [employer].

## 2024-01-23 NOTE — Progress Notes (Signed)
 01/23/2024  Reason for Visit:  Pilonidal cyst  Requesting Provider:  Alan Arrant, FNP  History of Present Illness: Rose Campos is a 33 y.o. female presenting for evaluation of a pilonidal cyst.  The patient reports recently noticing a bump in the lower back just superior to the gluteal cleft.  She had never noticed it before.  This grew in size and became more tender.  She saw her PCP on 01/16/24 and was started on oral antibiotics.  Based on the location, it was suspected to be a pilonidal cyst.  The patient denies any prior episodes of a cyst or mass in the same area.  Denies any drainage with this episode.  Reports that with the antibiotics, the pain and swelling have subsided.  Past Medical History: Past Medical History:  Diagnosis Date   Gastritis    Ovarian cyst    Polycystic ovary syndrome    Toe fracture, right    big toe   UTI (urinary tract infection)      Past Surgical History: Past Surgical History:  Procedure Laterality Date   COSMETIC SURGERY     lipo   DILATION AND CURETTAGE OF UTERUS  12/29/2022   pregnancy termination   WISDOM TOOTH EXTRACTION     four;  done at diff times throughout her 38s    Home Medications: Prior to Admission medications   Medication Sig Start Date End Date Taking? Authorizing Provider  fluticasone  (FLONASE ) 50 MCG/ACT nasal spray Place 1 spray into both nostrils daily. Begin by using 2 sprays in each nare daily for 3 to 5 days, then decrease to 1 spray in each nare daily. Patient taking differently: Place 1 spray into both nostrils as needed. Begin by using 2 sprays in each nare daily for 3 to 5 days, then decrease to 1 spray in each nare daily. 03/05/22  Yes Joesph Shaver Scales, PA-C  hydrOXYzine (ATARAX) 10 MG tablet TAKE 1 TABLET BY MOUTH UP TO THREE TIMES DAILY AS NEEDED 01/18/23  Yes Arrant Alan CHRISTELLA, FNP  ibuprofen  (ADVIL ) 800 MG tablet Take 1 tablet (800 mg total) by mouth every 8 (eight) hours as needed. 02/02/23  Yes  Connell Davies, MD  ondansetron  (ZOFRAN -ODT) 4 MG disintegrating tablet Take by mouth. 04/02/17  Yes [provider]  sulfamethoxazole -trimethoprim  (BACTRIM  DS) 800-160 MG tablet Take 1 tablet by mouth 2 (two) times daily. 01/16/24  Yes Arrant Alan CHRISTELLA, FNP  valACYclovir (VALTREX) 1000 MG tablet Take by mouth. 07/18/22  Yes [provider]    Allergies: Allergies  Allergen Reactions   Copper-Containing Compounds Rash   Nickel Other (See Comments) and Rash    Social History:  reports that she has been smoking cigarettes. She has a 2.5 pack-year smoking history. She has been exposed to tobacco smoke. She has never used smokeless tobacco. She reports that she does not currently use alcohol after a past usage of about 1.0 standard drink of alcohol per week. She reports that she does not currently use drugs.   Family History: Family History  Problem Relation Age of Onset   Lupus Mother    Atrial fibrillation Father    Healthy Brother    Hyperlipidemia Maternal Grandmother    Hypertension Maternal Grandmother    Cancer Maternal Grandmother 36       uterine   Kidney disease Maternal Grandfather    Breast cancer Maternal Great-grandmother     Review of Systems: Review of Systems  Constitutional:  Negative for chills and fever.  Respiratory:  Negative for shortness of breath.   Cardiovascular:  Negative for chest pain.  Gastrointestinal:  Negative for nausea and vomiting.  Skin:        Bump tender and swollen superior to gluteal cleft    Physical Exam BP 106/68   Pulse 93   Temp 98.8 F (37.1 C)   Ht 5' 11 (1.803 m)   Wt 188 lb (85.3 kg)   LMP 12/24/2023 (Exact Date)   SpO2 98%   BMI 26.22 kg/m  CONSTITUTIONAL: No acute distress, well nourished. HEENT:  Normocephalic, atraumatic, extraocular motion intact. RESPIRATORY:  Normal respiratory effort without pathologic use of accessory muscles. CARDIOVASCULAR: Regular rhythm and rate MUSCULOSKELETAL:  Normal  muscle strength and tone in all four extremities.  No peripheral edema or cyanosis. SKIN: The patient points to the area where the cyst was located, but on my exam, there is no palpable masses, skin changes, or skin pores that could be resulting in a cyst.  At the gluteal cleft itself, there are no pilonidal sinuses or pits.  There is no erythema, no induration. NEUROLOGIC:  Motor and sensation is grossly normal.  Cranial nerves are grossly intact. PSYCH:  Alert and oriented to person, place and time. Affect is normal.  Laboratory Analysis: No results found for this or any previous visit (from the past 24 hours).  Imaging: No results found.  Assessment and Plan: This is a 33 y.o. female with a possible infected sebaceous cyst.  --Discussed with the patient that at the gluteal cleft there are no pilonidal sinuses or pits to suggest the presence of a pilonidal cyst.  Furthermore, there is no trace of any infected cyst in the area superior to the gluteal cleft.  As such, at this point, there is no clear target for any possible excision procedure. --Discussed with the patient what a pilonidal cyst is and how they can form and that the sinuses lead into and feed the cyst.  If she were to have a pilonidal cyst, the recommendation would be for excision, but due to its depth and size, these are done under anesthesia in the OR.  However, if this were a more simple sebaceous cyst, the excision could be done in the office under local anesthesia.   --For now, since there is no clear target for excision and everything has resolved, recommended to the patient that we do watchful waiting, paying attention to recurrent symptoms.  If the cyst/bump recurs, then she can contact us  directly so we can evaluate her and figure better which type of cyst this is and how to treat it. --Also recommended that she continue her antibiotic course until it's completed. --Patient is in agreement with this plan and all of her  questions have been answered.  I spent 30 minutes dedicated to the care of this patient on the date of this encounter to include pre-visit review of records, face-to-face time with the patient discussing diagnosis and management, and any post-visit coordination of care.   Aloysius Sheree Plant, MD [facility]

## 2024-06-11 ENCOUNTER — Other Ambulatory Visit: Payer: Self-pay | Admitting: Unknown Physician Specialty

## 2024-06-11 DIAGNOSIS — R221 Localized swelling, mass and lump, neck: Secondary | ICD-10-CM | POA: Diagnosis not present

## 2024-06-18 ENCOUNTER — Inpatient Hospital Stay
Admission: RE | Admit: 2024-06-18 | Discharge: 2024-06-18 | Attending: Unknown Physician Specialty | Admitting: Unknown Physician Specialty

## 2024-06-18 DIAGNOSIS — E041 Nontoxic single thyroid nodule: Secondary | ICD-10-CM | POA: Diagnosis not present

## 2024-06-18 DIAGNOSIS — R221 Localized swelling, mass and lump, neck: Secondary | ICD-10-CM
